# Patient Record
Sex: Male | Born: 1940 | Race: White | Hispanic: No | Marital: Married | Smoking: Former smoker
Health system: Southern US, Community
[De-identification: ages and names within clinical notes are randomized; demographics above are authoritative.]

## PROBLEM LIST (undated history)

## (undated) DIAGNOSIS — Z8501 Personal history of malignant neoplasm of esophagus: Secondary | ICD-10-CM

## (undated) DIAGNOSIS — F32A Depression, unspecified: Secondary | ICD-10-CM

## (undated) DIAGNOSIS — F329 Major depressive disorder, single episode, unspecified: Secondary | ICD-10-CM

## (undated) DIAGNOSIS — K635 Polyp of colon: Secondary | ICD-10-CM

## (undated) DIAGNOSIS — N400 Enlarged prostate without lower urinary tract symptoms: Secondary | ICD-10-CM

## (undated) DIAGNOSIS — M25561 Pain in right knee: Secondary | ICD-10-CM

## (undated) DIAGNOSIS — C801 Malignant (primary) neoplasm, unspecified: Secondary | ICD-10-CM

## (undated) DIAGNOSIS — Z1379 Encounter for other screening for genetic and chromosomal anomalies: Principal | ICD-10-CM

## (undated) DIAGNOSIS — M1990 Unspecified osteoarthritis, unspecified site: Secondary | ICD-10-CM

## (undated) DIAGNOSIS — I4891 Unspecified atrial fibrillation: Secondary | ICD-10-CM

## (undated) DIAGNOSIS — K22719 Barrett's esophagus with dysplasia, unspecified: Secondary | ICD-10-CM

## (undated) DIAGNOSIS — M25512 Pain in left shoulder: Secondary | ICD-10-CM

## (undated) DIAGNOSIS — I499 Cardiac arrhythmia, unspecified: Secondary | ICD-10-CM

## (undated) DIAGNOSIS — N189 Chronic kidney disease, unspecified: Secondary | ICD-10-CM

## (undated) DIAGNOSIS — M25562 Pain in left knee: Secondary | ICD-10-CM

## (undated) DIAGNOSIS — E7800 Pure hypercholesterolemia, unspecified: Secondary | ICD-10-CM

## (undated) DIAGNOSIS — R972 Elevated prostate specific antigen [PSA]: Secondary | ICD-10-CM

## (undated) DIAGNOSIS — I639 Cerebral infarction, unspecified: Secondary | ICD-10-CM

## (undated) DIAGNOSIS — I10 Essential (primary) hypertension: Secondary | ICD-10-CM

## (undated) DIAGNOSIS — K219 Gastro-esophageal reflux disease without esophagitis: Secondary | ICD-10-CM

## (undated) HISTORY — PX: PROSTATE SURGERY: SHX751

## (undated) HISTORY — DX: Polyp of colon: K63.5

## (undated) HISTORY — PX: OTHER SURGICAL HISTORY: SHX169

## (undated) HISTORY — DX: Personal history of malignant neoplasm of esophagus: Z85.01

## (undated) HISTORY — PX: TONSILLECTOMY: SUR1361

## (undated) HISTORY — PX: KNEE ARTHROSCOPY: SUR90

## (undated) HISTORY — PX: ADENOIDECTOMY: SUR15

## (undated) HISTORY — DX: Encounter for other screening for genetic and chromosomal anomalies: Z13.79

---

## 2006-12-17 ENCOUNTER — Ambulatory Visit: Payer: Self-pay | Admitting: Gastroenterology

## 2007-09-24 ENCOUNTER — Ambulatory Visit: Payer: Self-pay

## 2007-10-18 ENCOUNTER — Other Ambulatory Visit: Payer: Self-pay

## 2007-10-18 ENCOUNTER — Ambulatory Visit: Payer: Self-pay | Admitting: Unknown Physician Specialty

## 2007-10-25 ENCOUNTER — Ambulatory Visit: Payer: Self-pay | Admitting: Unknown Physician Specialty

## 2008-09-18 DIAGNOSIS — I639 Cerebral infarction, unspecified: Secondary | ICD-10-CM

## 2008-09-18 HISTORY — DX: Cerebral infarction, unspecified: I63.9

## 2008-12-21 ENCOUNTER — Ambulatory Visit: Payer: Self-pay | Admitting: Gastroenterology

## 2009-05-01 ENCOUNTER — Emergency Department: Payer: Self-pay | Admitting: Emergency Medicine

## 2011-03-27 ENCOUNTER — Inpatient Hospital Stay: Payer: Self-pay | Admitting: Internal Medicine

## 2011-03-31 ENCOUNTER — Inpatient Hospital Stay: Payer: Self-pay | Admitting: Internal Medicine

## 2011-04-12 ENCOUNTER — Inpatient Hospital Stay: Payer: Self-pay | Admitting: Internal Medicine

## 2011-04-27 ENCOUNTER — Ambulatory Visit: Payer: Self-pay | Admitting: Urology

## 2011-05-02 ENCOUNTER — Emergency Department: Payer: Self-pay | Admitting: *Deleted

## 2011-05-10 ENCOUNTER — Ambulatory Visit: Payer: Self-pay | Admitting: Urology

## 2011-05-10 HISTORY — PX: TURP VAPORIZATION: SUR1397

## 2012-01-16 ENCOUNTER — Ambulatory Visit: Payer: Self-pay | Admitting: Unknown Physician Specialty

## 2012-04-15 DIAGNOSIS — N400 Enlarged prostate without lower urinary tract symptoms: Secondary | ICD-10-CM | POA: Insufficient documentation

## 2012-04-15 DIAGNOSIS — R972 Elevated prostate specific antigen [PSA]: Secondary | ICD-10-CM | POA: Insufficient documentation

## 2012-04-15 DIAGNOSIS — M25569 Pain in unspecified knee: Secondary | ICD-10-CM | POA: Insufficient documentation

## 2012-04-15 DIAGNOSIS — Z0189 Encounter for other specified special examinations: Secondary | ICD-10-CM | POA: Insufficient documentation

## 2012-04-15 DIAGNOSIS — I10 Essential (primary) hypertension: Secondary | ICD-10-CM | POA: Insufficient documentation

## 2012-04-15 DIAGNOSIS — K22719 Barrett's esophagus with dysplasia, unspecified: Secondary | ICD-10-CM | POA: Insufficient documentation

## 2012-10-11 DIAGNOSIS — F329 Major depressive disorder, single episode, unspecified: Secondary | ICD-10-CM | POA: Insufficient documentation

## 2012-10-11 DIAGNOSIS — E7800 Pure hypercholesterolemia, unspecified: Secondary | ICD-10-CM | POA: Insufficient documentation

## 2013-09-18 HISTORY — PX: CATARACT EXTRACTION: SUR2

## 2013-11-21 ENCOUNTER — Ambulatory Visit: Payer: Self-pay | Admitting: Unknown Physician Specialty

## 2014-12-22 DIAGNOSIS — Z87448 Personal history of other diseases of urinary system: Secondary | ICD-10-CM | POA: Insufficient documentation

## 2014-12-22 DIAGNOSIS — Z9079 Acquired absence of other genital organ(s): Secondary | ICD-10-CM | POA: Insufficient documentation

## 2015-09-16 DIAGNOSIS — M1711 Unilateral primary osteoarthritis, right knee: Secondary | ICD-10-CM | POA: Insufficient documentation

## 2016-09-07 DIAGNOSIS — M25512 Pain in left shoulder: Secondary | ICD-10-CM | POA: Insufficient documentation

## 2017-01-29 ENCOUNTER — Encounter: Payer: Self-pay | Admitting: *Deleted

## 2017-01-30 ENCOUNTER — Ambulatory Visit
Admission: RE | Admit: 2017-01-30 | Discharge: 2017-01-30 | Disposition: A | Discharge status: Home / Self Care | Payer: Medicare Other | Source: Ambulatory Visit | Attending: Gastroenterology | Admitting: Gastroenterology

## 2017-01-30 ENCOUNTER — Ambulatory Visit: Payer: Medicare Other | Admitting: Anesthesiology

## 2017-01-30 ENCOUNTER — Encounter
Admission: RE | Disposition: A | Discharge status: Home / Self Care | Payer: Self-pay | Source: Ambulatory Visit | Attending: Gastroenterology

## 2017-01-30 DIAGNOSIS — D122 Benign neoplasm of ascending colon: Secondary | ICD-10-CM | POA: Insufficient documentation

## 2017-01-30 DIAGNOSIS — N529 Male erectile dysfunction, unspecified: Secondary | ICD-10-CM | POA: Diagnosis not present

## 2017-01-30 DIAGNOSIS — Z1211 Encounter for screening for malignant neoplasm of colon: Secondary | ICD-10-CM | POA: Insufficient documentation

## 2017-01-30 DIAGNOSIS — E7800 Pure hypercholesterolemia, unspecified: Secondary | ICD-10-CM | POA: Diagnosis not present

## 2017-01-30 DIAGNOSIS — D120 Benign neoplasm of cecum: Secondary | ICD-10-CM | POA: Insufficient documentation

## 2017-01-30 DIAGNOSIS — F329 Major depressive disorder, single episode, unspecified: Secondary | ICD-10-CM | POA: Insufficient documentation

## 2017-01-30 DIAGNOSIS — I4891 Unspecified atrial fibrillation: Secondary | ICD-10-CM | POA: Insufficient documentation

## 2017-01-30 DIAGNOSIS — Z87891 Personal history of nicotine dependence: Secondary | ICD-10-CM | POA: Insufficient documentation

## 2017-01-30 DIAGNOSIS — N189 Chronic kidney disease, unspecified: Secondary | ICD-10-CM | POA: Diagnosis not present

## 2017-01-30 DIAGNOSIS — D127 Benign neoplasm of rectosigmoid junction: Secondary | ICD-10-CM | POA: Diagnosis not present

## 2017-01-30 DIAGNOSIS — Z79899 Other long term (current) drug therapy: Secondary | ICD-10-CM | POA: Insufficient documentation

## 2017-01-30 DIAGNOSIS — D124 Benign neoplasm of descending colon: Secondary | ICD-10-CM | POA: Diagnosis not present

## 2017-01-30 DIAGNOSIS — Z8673 Personal history of transient ischemic attack (TIA), and cerebral infarction without residual deficits: Secondary | ICD-10-CM | POA: Insufficient documentation

## 2017-01-30 DIAGNOSIS — K219 Gastro-esophageal reflux disease without esophagitis: Secondary | ICD-10-CM | POA: Diagnosis not present

## 2017-01-30 DIAGNOSIS — D123 Benign neoplasm of transverse colon: Secondary | ICD-10-CM | POA: Diagnosis not present

## 2017-01-30 DIAGNOSIS — N400 Enlarged prostate without lower urinary tract symptoms: Secondary | ICD-10-CM | POA: Diagnosis not present

## 2017-01-30 DIAGNOSIS — K5730 Diverticulosis of large intestine without perforation or abscess without bleeding: Secondary | ICD-10-CM | POA: Diagnosis not present

## 2017-01-30 DIAGNOSIS — I129 Hypertensive chronic kidney disease with stage 1 through stage 4 chronic kidney disease, or unspecified chronic kidney disease: Secondary | ICD-10-CM | POA: Insufficient documentation

## 2017-01-30 DIAGNOSIS — D125 Benign neoplasm of sigmoid colon: Secondary | ICD-10-CM | POA: Diagnosis not present

## 2017-01-30 HISTORY — DX: Depression, unspecified: F32.A

## 2017-01-30 HISTORY — DX: Essential (primary) hypertension: I10

## 2017-01-30 HISTORY — DX: Benign prostatic hyperplasia without lower urinary tract symptoms: N40.0

## 2017-01-30 HISTORY — PX: COLONOSCOPY WITH PROPOFOL: SHX5780

## 2017-01-30 HISTORY — DX: Unspecified osteoarthritis, unspecified site: M19.90

## 2017-01-30 HISTORY — DX: Barrett's esophagus with dysplasia, unspecified: K22.719

## 2017-01-30 HISTORY — DX: Major depressive disorder, single episode, unspecified: F32.9

## 2017-01-30 HISTORY — DX: Pain in right knee: M25.561

## 2017-01-30 HISTORY — DX: Gastro-esophageal reflux disease without esophagitis: K21.9

## 2017-01-30 HISTORY — DX: Elevated prostate specific antigen (PSA): R97.20

## 2017-01-30 HISTORY — DX: Chronic kidney disease, unspecified: N18.9

## 2017-01-30 HISTORY — DX: Cerebral infarction, unspecified: I63.9

## 2017-01-30 HISTORY — DX: Cardiac arrhythmia, unspecified: I49.9

## 2017-01-30 HISTORY — DX: Pain in left knee: M25.562

## 2017-01-30 HISTORY — DX: Pure hypercholesterolemia, unspecified: E78.00

## 2017-01-30 HISTORY — DX: Pain in left shoulder: M25.512

## 2017-01-30 SURGERY — COLONOSCOPY WITH PROPOFOL
Anesthesia: General

## 2017-01-30 MED ORDER — PROPOFOL 10 MG/ML IV BOLUS
INTRAVENOUS | Status: DC | PRN
  Administered 2017-01-30: 50 mg via INTRAVENOUS

## 2017-01-30 MED ORDER — EPHEDRINE SULFATE 50 MG/ML IJ SOLN
INTRAMUSCULAR | Status: AC
  Filled 2017-01-30: qty 1

## 2017-01-30 MED ORDER — SODIUM CHLORIDE 0.9 % IV SOLN
INTRAVENOUS | Status: DC
Start: 2017-01-30 — End: 2017-01-30

## 2017-01-30 MED ORDER — LIDOCAINE HCL 2 % IJ SOLN
INTRAMUSCULAR | Status: AC
  Filled 2017-01-30: qty 10

## 2017-01-30 MED ORDER — FENTANYL CITRATE (PF) 100 MCG/2ML IJ SOLN
INTRAMUSCULAR | Status: DC | PRN
  Administered 2017-01-30: 50 ug via INTRAVENOUS

## 2017-01-30 MED ORDER — PHENYLEPHRINE HCL 10 MG/ML IJ SOLN
INTRAMUSCULAR | Status: DC | PRN
  Administered 2017-01-30 (×2): 100 ug via INTRAVENOUS

## 2017-01-30 MED ORDER — PROPOFOL 500 MG/50ML IV EMUL
INTRAVENOUS | Status: AC
  Filled 2017-01-30: qty 50

## 2017-01-30 MED ORDER — PROPOFOL 500 MG/50ML IV EMUL
INTRAVENOUS | Status: DC | PRN
  Administered 2017-01-30: 140 ug/kg/min via INTRAVENOUS

## 2017-01-30 MED ORDER — PROPOFOL 10 MG/ML IV BOLUS
INTRAVENOUS | Status: AC
  Filled 2017-01-30: qty 20

## 2017-01-30 MED ORDER — LIDOCAINE HCL (CARDIAC) 20 MG/ML IV SOLN
INTRAVENOUS | Status: DC | PRN
  Administered 2017-01-30: 40 mg via INTRAVENOUS

## 2017-01-30 MED ORDER — GLYCOPYRROLATE 0.2 MG/ML IJ SOLN
INTRAMUSCULAR | Status: DC | PRN
  Administered 2017-01-30: .2 mg via INTRAVENOUS

## 2017-01-30 MED ORDER — FENTANYL CITRATE (PF) 100 MCG/2ML IJ SOLN
INTRAMUSCULAR | Status: AC
  Filled 2017-01-30: qty 2

## 2017-01-30 MED ORDER — GLYCOPYRROLATE 0.2 MG/ML IJ SOLN
INTRAMUSCULAR | Status: AC
  Filled 2017-01-30: qty 1

## 2017-01-30 MED ORDER — SODIUM CHLORIDE 0.9 % IV SOLN
INTRAVENOUS | Status: DC
Start: 2017-01-30 — End: 2017-01-30
  Administered 2017-01-30: 14:00:00 via INTRAVENOUS

## 2017-01-30 MED ORDER — EPHEDRINE SULFATE 50 MG/ML IJ SOLN
INTRAMUSCULAR | Status: DC | PRN
  Administered 2017-01-30: 10 mg via INTRAVENOUS

## 2017-01-30 NOTE — Anesthesia Post-op Follow-up Note (Cosign Needed)
Anesthesia QCDR form completed.        

## 2017-01-30 NOTE — Op Note (Signed)
[facility] Gastroenterology Patient Name: Blake Jenkins Procedure Date: 01/30/2017 1:22 PM MRN: [id number] Account #: [id number] Date of Birth: Dec 18, 1940 Admit Type: Outpatient Age: 76 Room: [facility] ENDO ROOM 3 Gender: Male Note Status: Finalized Procedure:            Colonoscopy Indications:          Screening for colorectal malignant neoplasm Providers:            Lollie Sails, MD Referring MD:         Bo Mcclintock. Vickki Muff (Referring MD) Medicines:            Monitored Anesthesia Care Complications:        No immediate complications. Procedure:            Pre-Anesthesia Assessment:                       - ASA Grade Assessment: III - A patient with severe                        systemic disease.                       After obtaining informed consent, the colonoscope was                        passed under direct vision. Throughout the procedure,                        the patient's blood pressure, pulse, and oxygen                        saturations were monitored continuously. The                        Colonoscope was introduced through the anus and                        advanced to the the cecum, identified by appendiceal                        orifice and ileocecal valve. The colonoscopy was                        unusually difficult due to poor bowel prep and a                        tortuous colon. Successful completion of the procedure                        was aided by changing the patient to a supine position,                        changing the patient to a prone position and using                        manual pressure. The quality of the bowel preparation                        was fair except the ascending colon was poor. Findings:      A 4 mm  polyp was found in the recto-sigmoid colon. The polyp was       sessile. The polyp was removed with a cold snare. Resection and       retrieval were complete.      A 5 mm polyp was found in the descending colon.  The polyp was sessile.       The polyp was removed with a cold snare. Resection and retrieval were       complete.      A 3 mm polyp was found in the proximal transverse colon. The polyp was       sessile. The polyp was removed with a cold biopsy forceps. Resection and       retrieval were complete.      A 7 mm polyp was found in the proximal transverse colon. The polyp was       sessile. The polyp was removed with a cold snare. Resection and       retrieval were complete.      A 4 mm polyp was found in the transverse colon. The polyp was sessile.       The polyp was removed with a cold snare. Resection and retrieval were       complete.      Three sessile and semi-pedunculated polyps were found in the cecum. The       polyps were 4 to 10 mm in size. These polyps were removed with a cold       snare. Resection and retrieval were complete.      A 5 mm polyp was found in the hepatic flexure. The polyp was sessile.       The polyp was removed with a cold snare. Resection and retrieval were       complete.      Two sessile polyps were found in the ascending colon. The polyps were 4       to 5 mm in size. These polyps were removed with a cold snare. Resection       and retrieval were complete.      Two sessile polyps were found in the ascending colon. The polyps were 3       to 4 mm in size. These polyps were removed with a cold snare. Resection       and retrieval were complete.      A 3 mm polyp was found in the proximal transverse colon. The polyp was       sessile. The polyp was removed with a cold biopsy forceps. Resection and       retrieval were complete.      A 5 mm polyp was found in the mid sigmoid colon. The polyp was sessile.       The polyp was removed with a cold biopsy forceps. Resection and       retrieval were complete.      multiple sessile polyps were found in the right colon, removed with       snare and forcep, not placed serially but in a single jar.      A few  small-mouthed diverticula were found in the sigmoid colon.      The digital rectal exam was normal. Impression:           - One 4 mm polyp at the recto-sigmoid colon, removed  with a cold snare. Resected and retrieved.                       - One 5 mm polyp in the descending colon, removed with                        a cold snare. Resected and retrieved.                       - One 3 mm polyp in the proximal transverse colon,                        removed with a cold biopsy forceps. Resected and                        retrieved.                       - One 7 mm polyp in the proximal transverse colon,                        removed with a cold snare. Resected and retrieved.                       - One 4 mm polyp in the transverse colon, removed with                        a cold snare. Resected and retrieved.                       - Three 4 to 10 mm polyps in the cecum, removed with a                        cold snare. Resected and retrieved.                       - One 5 mm polyp at the hepatic flexure, removed with a                        cold snare. Resected and retrieved.                       - Two 4 to 5 mm polyps in the ascending colon, removed                        with a cold snare. Resected and retrieved.                       - Two 3 to 4 mm polyps in the ascending colon, removed                        with a cold snare. Resected and retrieved.                       - One 3 mm polyp in the proximal transverse colon,                        removed with a cold biopsy forceps. Resected and  retrieved.                       - One 5 mm polyp in the mid sigmoid colon, removed with                        a cold biopsy forceps. Resected and retrieved.                       - Multiple polyps.                       - Diverticulosis in the sigmoid colon. Recommendation:       - Await pathology results.                       - Clear liquid  diet today.                       - Full liquid diet for 1 day, then advance as tolerated                        to soft diet for 3 days. Procedure Code(s):    --- Professional ---                       [phone], Colonoscopy, flexible; with removal of tumor(s),                        polyp(s), or other lesion(s) by snare technique                       45380, 25, Colonoscopy, flexible; with biopsy, single                        or multiple Diagnosis Code(s):    --- Professional ---                       Z12.11, Encounter for screening for malignant neoplasm                        of colon                       D12.7, Benign neoplasm of rectosigmoid junction                       D12.4, Benign neoplasm of descending colon                       D12.3, Benign neoplasm of transverse colon (hepatic                        flexure or splenic flexure)                       D12.5, Benign neoplasm of sigmoid colon                       D12.0, Benign neoplasm of cecum                       D12.2, Benign neoplasm of ascending colon  K57.30, Diverticulosis of large intestine without                        perforation or abscess without bleeding CPT copyright 2016 [employer]. All rights reserved. The codes documented in this report are preliminary and upon coder review may  be revised to meet current compliance requirements. Lollie Sails, MD 01/30/2017 3:30:58 PM This report has been signed electronically. Number of Addenda: 0 Note Initiated On: 01/30/2017 1:22 PM Scope Withdrawal Time: 0 hours 58 minutes 58 seconds  Total Procedure Duration: 1 hour 31 minutes 34 seconds       [facility]

## 2017-01-30 NOTE — Anesthesia Preprocedure Evaluation (Signed)
Anesthesia Evaluation  Patient identified by MRN, date of birth, ID band Patient awake    Reviewed: Allergy & Precautions, H&P , NPO status , Patient's Chart, lab work & pertinent test results, reviewed documented beta blocker date and time   Airway Mallampati: II   Neck ROM: full    Dental  (+) Poor Dentition, Teeth Intact   Pulmonary neg pulmonary ROS, former smoker,    Pulmonary exam normal        Cardiovascular hypertension, On Medications negative cardio ROS Normal cardiovascular exam+ dysrhythmias  Rhythm:regular Rate:Normal     Neuro/Psych PSYCHIATRIC DISORDERS CVA negative neurological ROS  negative psych ROS   GI/Hepatic negative GI ROS, Neg liver ROS, GERD  Medicated,  Endo/Other  negative endocrine ROS  Renal/GU Renal diseasenegative Renal ROS  negative genitourinary   Musculoskeletal   Abdominal   Peds  Hematology negative hematology ROS (+)   Anesthesia Other Findings Past Medical History: No date: Arthritis     Comment: RIGHT KNEE No date: Barrett's esophagus with dysplasia     Comment: resection 2011 No date: Chronic kidney disease No date: Depression No date: Dysrhythmia     Comment: A-FIB,TRANSIENT No date: Elevated PSA No date: GERD (gastroesophageal reflux disease) No date: Hypercholesteremia No date: Hypertension No date: Hypertrophy of prostate without urinary obstru* No date: Knee pain, bilateral No date: Shoulder pain, left No date: Stroke [facility]) Past Surgical History: No date: ADENOIDECTOMY No date: ESOPHAGEAL RESECTION No date: KNEE ARTHROSCOPY Left No date: PROSTATE SURGERY No date: TONSILLECTOMY 05/10/2011: TURP VAPORIZATION BMI    Body Mass Index:  22.96 kg/m     Reproductive/Obstetrics negative OB ROS                             Anesthesia Physical Anesthesia Plan  ASA: III  Anesthesia Plan: General   Post-op Pain Management:     Induction:   Airway Management Planned:   Additional Equipment:   Intra-op Plan:   Post-operative Plan:   Informed Consent: I have reviewed the patients History and Physical, chart, labs and discussed the procedure including the risks, benefits and alternatives for the proposed anesthesia with the patient or authorized representative who has indicated his/her understanding and acceptance.   Dental Advisory Given  Plan Discussed with: CRNA  Anesthesia Plan Comments:         Anesthesia Quick Evaluation

## 2017-01-30 NOTE — H&P (Signed)
Outpatient short stay form Pre-procedure 01/30/2017 1:16 PM Lollie Sails MD  Primary Physician: Dr. Kirkland Hun  Reason for visit:  Colonoscopy  History of present illness:  Patient is a 76 year old male presenting today as above. He tolerated his prep well. He takes no aspirin or blood thinning products. His last colonoscopy was over 10 years ago. He does get regular endoscopies in [place] with Dr. Felton Clinton due to a history of Barrett's esophagus with partial/distal esophagectomy.    Current Facility-Administered Medications:  .  0.9 %  sodium chloride infusion, , Intravenous, Continuous, Lollie Sails, MD .  0.9 %  sodium chloride infusion, , Intravenous, Continuous, Lollie Sails, MD  Prescriptions Prior to Admission  Medication Sig Dispense Refill Last Dose  . atorvastatin (LIPITOR) 20 MG tablet Take 20 mg by mouth daily.   01/29/2017 at Unknown time  . lisinopril (PRINIVIL,ZESTRIL) 5 MG tablet Take 5 mg by mouth daily.   01/29/2017 at Unknown time  . omeprazole (PRILOSEC) 40 MG capsule Take 40 mg by mouth daily.   01/29/2017 at Unknown time  . polyethylene glycol powder (GLYCOLAX/MIRALAX) powder Take 1 Container by mouth once.     . sertraline (ZOLOFT) 100 MG tablet Take 100 mg by mouth daily.   01/29/2017 at Unknown time  . sildenafil (VIAGRA) 100 MG tablet Take 100 mg by mouth daily as needed for erectile dysfunction.        Not on File   Past Medical History:  Diagnosis Date  . Arthritis    RIGHT KNEE  . Barrett's esophagus with dysplasia    resection 2011  . Chronic kidney disease   . Depression   . Dysrhythmia    A-FIB,TRANSIENT  . Elevated PSA   . GERD (gastroesophageal reflux disease)   . Hypercholesteremia   . Hypertension   . Hypertrophy of prostate without urinary obstruction   . Knee pain, bilateral   . Shoulder pain, left   . Stroke [facility])     Review of systems:      Physical Exam    Heart and lungs: Regular rate and rhythm without rub or  gallop, lungs are bilaterally clear.    HEENT: Normocephalic atraumatic eyes are anicteric    Other:     Pertinant exam for procedure: Soft nontender nondistended bowel sounds positive normoactive.    Planned proceedures: Colonoscopy and indicated procedures. I have discussed the risks benefits and complications of procedures to include not limited to bleeding, infection, perforation and the risk of sedation and the patient wishes to proceed.    Lollie Sails, MD Gastroenterology 01/30/2017  1:16 PM

## 2017-01-30 NOTE — Anesthesia Procedure Notes (Signed)
Date/Time: 01/30/2017 1:30 PM Performed by: Allean Found Pre-anesthesia Checklist: Patient identified, Emergency Drugs available, Suction available, Patient being monitored and Timeout performed Patient Re-evaluated:Patient Re-evaluated prior to inductionOxygen Delivery Method: Nasal cannula Placement Confirmation: positive ETCO2

## 2017-01-30 NOTE — Transfer of Care (Signed)
Immediate Anesthesia Transfer of Care Note  Patient: Blake Jenkins  Procedure(s) Performed: Procedure(s): COLONOSCOPY WITH PROPOFOL (N/A)  Patient Location: PACU  Anesthesia Type:General  Level of Consciousness: awake  Airway & Oxygen Therapy: Patient Spontanous Breathing and Patient connected to nasal cannula oxygen  Post-op Assessment: Report given to RN and Post -op Vital signs reviewed and stable  Post vital signs: Reviewed and stable  Last Vitals:  Vitals:   01/30/17 1151  BP: 122/83  Pulse: (!) 54  Resp: (!) 22  Temp: (!) 35.7 C    Last Pain:  Vitals:   01/30/17 1151  TempSrc: Tympanic         Complications: No apparent anesthesia complications

## 2017-01-31 ENCOUNTER — Encounter: Payer: Self-pay | Admitting: Gastroenterology

## 2017-02-01 LAB — SURGICAL PATHOLOGY

## 2017-02-02 NOTE — Anesthesia Postprocedure Evaluation (Signed)
Anesthesia Post Note  Patient: Blake Jenkins  Procedure(s) Performed: Procedure(s) (LRB): COLONOSCOPY WITH PROPOFOL (N/A)  Patient location during evaluation: Endoscopy Anesthesia Type: General Level of consciousness: awake and alert Pain management: pain level controlled Vital Signs Assessment: post-procedure vital signs reviewed and stable Respiratory status: spontaneous breathing, nonlabored ventilation, respiratory function stable and patient connected to nasal cannula oxygen Cardiovascular status: blood pressure returned to baseline and stable Postop Assessment: no signs of nausea or vomiting Anesthetic complications: no     Last Vitals:  Vitals:   01/30/17 1553 01/30/17 1603  BP: 105/76 107/87  Pulse: (!) 57 (!) 54  Resp: 16 20  Temp:      Last Pain:  Vitals:   01/30/17 1523  TempSrc: Tympanic                 Martha Clan

## 2017-02-06 NOTE — Addendum Note (Signed)
Addendum  created 02/06/17 1343 by Molli Barrows, MD   Anesthesia Attestations filed, Anesthesia Event edited

## 2017-08-14 DIAGNOSIS — D126 Benign neoplasm of colon, unspecified: Secondary | ICD-10-CM | POA: Insufficient documentation

## 2017-12-17 ENCOUNTER — Telehealth: Payer: Self-pay | Admitting: Genetic Counselor

## 2017-12-17 ENCOUNTER — Encounter: Payer: Self-pay | Admitting: Genetic Counselor

## 2017-12-17 NOTE — Telephone Encounter (Signed)
A genetic counseling appt has been scheduled for the pt to see Ofri on 4/11 at 10:15am. Pt aware to arrive 30 minutes early. Letter mailed.

## 2017-12-19 ENCOUNTER — Encounter: Payer: Self-pay | Admitting: *Deleted

## 2017-12-20 ENCOUNTER — Encounter
Admission: RE | Disposition: A | Discharge status: Home / Self Care | Payer: Self-pay | Source: Ambulatory Visit | Attending: Gastroenterology

## 2017-12-20 ENCOUNTER — Ambulatory Visit
Admission: RE | Admit: 2017-12-20 | Discharge: 2017-12-20 | Disposition: A | Discharge status: Home / Self Care | Payer: Medicare Other | Source: Ambulatory Visit | Attending: Gastroenterology | Admitting: Gastroenterology

## 2017-12-20 ENCOUNTER — Ambulatory Visit: Payer: Medicare Other | Admitting: Anesthesiology

## 2017-12-20 DIAGNOSIS — I4891 Unspecified atrial fibrillation: Secondary | ICD-10-CM | POA: Diagnosis not present

## 2017-12-20 DIAGNOSIS — I129 Hypertensive chronic kidney disease with stage 1 through stage 4 chronic kidney disease, or unspecified chronic kidney disease: Secondary | ICD-10-CM | POA: Insufficient documentation

## 2017-12-20 DIAGNOSIS — D123 Benign neoplasm of transverse colon: Secondary | ICD-10-CM | POA: Insufficient documentation

## 2017-12-20 DIAGNOSIS — D120 Benign neoplasm of cecum: Secondary | ICD-10-CM | POA: Diagnosis not present

## 2017-12-20 DIAGNOSIS — Z87891 Personal history of nicotine dependence: Secondary | ICD-10-CM | POA: Diagnosis not present

## 2017-12-20 DIAGNOSIS — K635 Polyp of colon: Secondary | ICD-10-CM | POA: Diagnosis not present

## 2017-12-20 DIAGNOSIS — K5730 Diverticulosis of large intestine without perforation or abscess without bleeding: Secondary | ICD-10-CM | POA: Diagnosis not present

## 2017-12-20 DIAGNOSIS — N400 Enlarged prostate without lower urinary tract symptoms: Secondary | ICD-10-CM | POA: Insufficient documentation

## 2017-12-20 DIAGNOSIS — F329 Major depressive disorder, single episode, unspecified: Secondary | ICD-10-CM | POA: Insufficient documentation

## 2017-12-20 DIAGNOSIS — Z8673 Personal history of transient ischemic attack (TIA), and cerebral infarction without residual deficits: Secondary | ICD-10-CM | POA: Insufficient documentation

## 2017-12-20 DIAGNOSIS — Z79899 Other long term (current) drug therapy: Secondary | ICD-10-CM | POA: Diagnosis not present

## 2017-12-20 DIAGNOSIS — Z1211 Encounter for screening for malignant neoplasm of colon: Secondary | ICD-10-CM | POA: Insufficient documentation

## 2017-12-20 DIAGNOSIS — Z86010 Personal history of colonic polyps: Secondary | ICD-10-CM | POA: Diagnosis not present

## 2017-12-20 DIAGNOSIS — K219 Gastro-esophageal reflux disease without esophagitis: Secondary | ICD-10-CM | POA: Diagnosis not present

## 2017-12-20 DIAGNOSIS — K621 Rectal polyp: Secondary | ICD-10-CM | POA: Diagnosis not present

## 2017-12-20 DIAGNOSIS — M1711 Unilateral primary osteoarthritis, right knee: Secondary | ICD-10-CM | POA: Insufficient documentation

## 2017-12-20 DIAGNOSIS — N189 Chronic kidney disease, unspecified: Secondary | ICD-10-CM | POA: Insufficient documentation

## 2017-12-20 HISTORY — PX: COLONOSCOPY WITH PROPOFOL: SHX5780

## 2017-12-20 HISTORY — DX: Benign prostatic hyperplasia without lower urinary tract symptoms: N40.0

## 2017-12-20 SURGERY — COLONOSCOPY WITH PROPOFOL
Anesthesia: General

## 2017-12-20 MED ORDER — EPHEDRINE SULFATE 50 MG/ML IJ SOLN
INTRAMUSCULAR | Status: DC | PRN
  Administered 2017-12-20: 10 mg via INTRAVENOUS

## 2017-12-20 MED ORDER — PROPOFOL 500 MG/50ML IV EMUL
INTRAVENOUS | Status: AC
  Filled 2017-12-20: qty 50

## 2017-12-20 MED ORDER — PHENYLEPHRINE HCL 10 MG/ML IJ SOLN
INTRAMUSCULAR | Status: DC | PRN
  Administered 2017-12-20: 200 ug via INTRAVENOUS

## 2017-12-20 MED ORDER — LIDOCAINE HCL (CARDIAC) 20 MG/ML IV SOLN
INTRAVENOUS | Status: DC | PRN
  Administered 2017-12-20: 100 mg via INTRAVENOUS

## 2017-12-20 MED ORDER — PROPOFOL 10 MG/ML IV BOLUS
INTRAVENOUS | Status: DC | PRN
  Administered 2017-12-20: 100 mg via INTRAVENOUS

## 2017-12-20 MED ORDER — PROPOFOL 500 MG/50ML IV EMUL
INTRAVENOUS | Status: DC | PRN
  Administered 2017-12-20: 125 ug/kg/min via INTRAVENOUS

## 2017-12-20 MED ORDER — SODIUM CHLORIDE 0.9 % IV SOLN
INTRAVENOUS | Status: DC
  Administered 2017-12-20: 1000 mL via INTRAVENOUS

## 2017-12-20 MED ORDER — SODIUM CHLORIDE 0.9 % IV SOLN
INTRAVENOUS | Status: DC
  Administered 2017-12-20: 09:00:00 via INTRAVENOUS

## 2017-12-20 NOTE — Transfer of Care (Signed)
Immediate Anesthesia Transfer of Care Note  Patient: Blake Jenkins  Procedure(s) Performed: COLONOSCOPY WITH PROPOFOL (N/A )  Patient Location: PACU  Anesthesia Type:General  Level of Consciousness: awake and alert   Airway & Oxygen Therapy: Patient Spontanous Breathing and Patient connected to nasal cannula oxygen  Post-op Assessment: Report given to RN and Post -op Vital signs reviewed and stable  Post vital signs: Reviewed and stable  Last Vitals:  Vitals Value Taken Time  BP    Temp    Pulse 68 12/20/2017  9:55 AM  Resp 17 12/20/2017  9:55 AM  SpO2 100 % 12/20/2017  9:55 AM  Vitals shown include unvalidated device data.  Last Pain:  Vitals:   12/20/17 0824  TempSrc: Tympanic  PainSc: 0-No pain         Complications: No apparent anesthesia complications

## 2017-12-20 NOTE — Anesthesia Postprocedure Evaluation (Signed)
Anesthesia Post Note  Patient: Blake Jenkins Myrie  Procedure(s) Performed: COLONOSCOPY WITH PROPOFOL (N/A )  Patient location during evaluation: Endoscopy Anesthesia Type: General Level of consciousness: awake and alert Pain management: pain level controlled Vital Signs Assessment: post-procedure vital signs reviewed and stable Respiratory status: spontaneous breathing and respiratory function stable Cardiovascular status: stable Anesthetic complications: no     Last Vitals:  Vitals:   12/20/17 0824 12/20/17 0955  BP: 122/88   Pulse: (!) 57   Resp: 20 17  Temp: (!) 36.2 C (!) 36.1 C  SpO2: 100%     Last Pain:  Vitals:   12/20/17 0955  TempSrc: Tympanic  PainSc: Asleep                 Yaseen Gilberg K

## 2017-12-20 NOTE — Anesthesia Preprocedure Evaluation (Addendum)
Anesthesia Evaluation  Patient identified by MRN, date of birth, ID band Patient awake    Reviewed: Allergy & Precautions, NPO status , Patient's Chart, lab work & pertinent test results  History of Anesthesia Complications Negative for: history of anesthetic complications  Airway Mallampati: II       Dental   Pulmonary neg sleep apnea, neg COPD, former smoker,           Cardiovascular hypertension, Pt. on medications (-) Past MI and (-) CHF (-) dysrhythmias (-) Valvular Problems/Murmurs     Neuro/Psych neg Seizures Depression    GI/Hepatic Neg liver ROS, GERD  Medicated and Poorly Controlled,  Endo/Other  neg diabetes  Renal/GU Renal InsufficiencyRenal disease     Musculoskeletal   Abdominal   Peds  Hematology   Anesthesia Other Findings   Reproductive/Obstetrics                            Anesthesia Physical Anesthesia Plan  ASA: II  Anesthesia Plan: General   Post-op Pain Management:    Induction:   PONV Risk Score and Plan: 2 and Propofol infusion and TIVA  Airway Management Planned: Nasal Cannula  Additional Equipment:   Intra-op Plan:   Post-operative Plan:   Informed Consent: I have reviewed the patients History and Physical, chart, labs and discussed the procedure including the risks, benefits and alternatives for the proposed anesthesia with the patient or authorized representative who has indicated his/her understanding and acceptance.     Plan Discussed with:   Anesthesia Plan Comments:         Anesthesia Quick Evaluation

## 2017-12-20 NOTE — Op Note (Signed)
[facility] Gastroenterology Patient Name: Blake Jenkins Procedure Date: 12/20/2017 9:06 AM MRN: [id number] Account #: [id number] Date of Birth: Jan 05, 1941 Admit Type: Outpatient Age: 77 Room: [facility] ENDO ROOM 1 Gender: Male Note Status: Finalized Procedure:            Colonoscopy Indications:          Personal history of colonic polyps Providers:            Lollie Sails, MD Referring MD:         Bo Mcclintock. Vickki Muff (Referring MD) Medicines:            Monitored Anesthesia Care Complications:        No immediate complications. Procedure:            Pre-Anesthesia Assessment:                       - ASA Grade Assessment: II - A patient with mild                        systemic disease.                       After obtaining informed consent, the colonoscope was                        passed under direct vision. Throughout the procedure,                        the patient's blood pressure, pulse, and oxygen                        saturations were monitored continuously. The                        Colonoscope was introduced through the anus and                        advanced to the the cecum, identified by appendiceal                        orifice and ileocecal valve. The colonoscopy was                        performed with moderate difficulty due to a tortuous                        colon. Successful completion of the procedure was aided                        by changing the patient to a supine position, changing                        the patient to a prone position and using manual                        pressure. The patient tolerated the procedure well. The                        quality of the bowel preparation was fair. Findings:      Multiple small-mouthed diverticula were found  in the sigmoid colon,       descending colon and transverse colon.      Two sessile polyps were found in the sigmoid colon. The polyps were 1 to       2 mm in size. These polyps were  removed with a cold biopsy forceps.       Resection and retrieval were complete.      A 10 mm polyp was found in the proximal transverse colon. The polyp was       pedunculated. The polyp was removed with a hot snare. Resection and       retrieval were complete.      A 7 mm polyp was found in the mid transverse colon. The polyp was       pedunculated. The polyp was removed with a hot snare. Resection and       retrieval were complete.      A 3 mm polyp was found in the cecum. The polyp was sessile. The polyp       was removed with a piecemeal technique using a cold biopsy forceps.       Resection and retrieval were complete.      A 3 mm polyp was found in the proximal ascending colon. The polyp was       sessile. The polyp was removed with a cold biopsy forceps. Resection and       retrieval were complete.      A 2 mm polyp was found in the recto-sigmoid colon. The polyp was       sessile. The polyp was removed with a cold biopsy forceps. Resection and       retrieval were complete.      The digital rectal exam findings include enlarged prostate.      No additional abnormalities were found on retroflexion. Impression:           - Preparation of the colon was fair.                       - Diverticulosis in the sigmoid colon, in the                        descending colon and in the transverse colon.                       - Two 1 to 2 mm polyps in the sigmoid colon, removed                        with a cold biopsy forceps. Resected and retrieved.                       - One 10 mm polyp in the proximal transverse colon,                        removed with a hot snare. Resected and retrieved.                       - One 7 mm polyp in the mid transverse colon, removed                        with a hot snare. Resected and retrieved.                       -  One 3 mm polyp in the cecum, removed piecemeal using                        a cold biopsy forceps. Resected and retrieved.                        - One 3 mm polyp in the proximal ascending colon,                        removed with a cold biopsy forceps. Resected and                        retrieved.                       - One 2 mm polyp at the recto-sigmoid colon, removed                        with a cold biopsy forceps. Resected and retrieved.                       - Enlarged prostate found on digital rectal exam. Recommendation:       - Discharge patient to home.                       - Telephone GI clinic for pathology results in 1 week. Procedure Code(s):    --- Professional ---                       [phone], Colonoscopy, flexible; with removal of tumor(s),                        polyp(s), or other lesion(s) by snare technique                       45380, 77, Colonoscopy, flexible; with biopsy, single                        or multiple Diagnosis Code(s):    --- Professional ---                       D12.5, Benign neoplasm of sigmoid colon                       D12.3, Benign neoplasm of transverse colon (hepatic                        flexure or splenic flexure)                       D12.0, Benign neoplasm of cecum                       D12.2, Benign neoplasm of ascending colon                       D12.7, Benign neoplasm of rectosigmoid junction                       Z86.010, Personal history of colonic polyps  K57.30, Diverticulosis of large intestine without                        perforation or abscess without bleeding                       N40.0, Benign prostatic hyperplasia without lower                        urinary tract symptoms CPT copyright 2017 [employer]. All rights reserved. The codes documented in this report are preliminary and upon coder review may  be revised to meet current compliance requirements. Lollie Sails, MD 12/20/2017 9:53:42 AM This report has been signed electronically. Number of Addenda: 0 Note Initiated On: 12/20/2017 9:06 AM Scope Withdrawal  Time: 0 hours 7 minutes 0 seconds  Total Procedure Duration: 0 hours 12 minutes 31 seconds       [facility]

## 2017-12-20 NOTE — H&P (Signed)
Outpatient short stay form Pre-procedure 12/20/2017 9:04 AM Blake Sails MD  Primary Physician: Dr. Norman Clay  Reason for visit: Colonoscopy  History of present illness: Patient is a 77 year old male presenting today for colonoscopy.  He had a colonoscopy about a year ago with the removal of about 15 polyps all of which were adenoma.  Genetic study is pending.  At that time he had a poor prep for colonoscopy.  He is presenting today for repeat procedure.  He tolerated his prep well.  He takes no aspirin or blood thinning agent.  He states he is much better prep today.    Current Facility-Administered Medications:  .  0.9 %  sodium chloride infusion, , Intravenous, Continuous, Blake Sails, MD, Last Rate: 20 mL/hr at 12/20/17 0842, 1,000 mL at 12/20/17 0842  Medications Prior to Admission  Medication Sig Dispense Refill Last Dose  . atorvastatin (LIPITOR) 20 MG tablet Take 20 mg by mouth daily.   12/19/2017 at Unknown time  . lisinopril (PRINIVIL,ZESTRIL) 5 MG tablet Take 5 mg by mouth daily.   12/19/2017 at Unknown time  . omeprazole (PRILOSEC) 40 MG capsule Take 40 mg by mouth daily.   12/19/2017 at Unknown time  . polyethylene glycol powder (GLYCOLAX/MIRALAX) powder Take 1 Container by mouth once.   12/19/2017 at Unknown time  . sertraline (ZOLOFT) 100 MG tablet Take 100 mg by mouth daily.   12/19/2017 at Unknown time  . sildenafil (VIAGRA) 100 MG tablet Take 100 mg by mouth daily as needed for erectile dysfunction.   Past Week at Unknown time     No Known Allergies   Past Medical History:  Diagnosis Date  . Arthritis    RIGHT KNEE  . Barrett's esophagus with dysplasia    resection 2011  . BPH (benign prostatic hyperplasia)   . Chronic kidney disease   . Depression   . Dysrhythmia    A-FIB,TRANSIENT  . Elevated PSA   . GERD (gastroesophageal reflux disease)   . Hypercholesteremia   . Hypertension   . Hypertrophy of prostate without urinary obstruction   . Knee pain,  bilateral   . Shoulder pain, left   . Stroke [facility])     Review of systems:      Physical Exam    Heart and lungs: Regular rate and rhythm without rub or gallop, lungs are bilaterally clear.    HEENT: Normocephalic atraumatic eyes are anicteric    Other:    Pertinant exam for procedure: Soft nontender nondistended bowel sounds positive normoactive.    Planned proceedures: Colonoscopy and indicated procedures. I have discussed the risks benefits and complications of procedures to include not limited to bleeding, infection, perforation and the risk of sedation and the patient wishes to proceed.    Blake Sails, MD Gastroenterology 12/20/2017  9:04 AM

## 2017-12-20 NOTE — Anesthesia Post-op Follow-up Note (Signed)
Anesthesia QCDR form completed.        

## 2017-12-21 ENCOUNTER — Encounter: Payer: Self-pay | Admitting: Gastroenterology

## 2017-12-21 LAB — SURGICAL PATHOLOGY

## 2017-12-27 ENCOUNTER — Inpatient Hospital Stay: Payer: Medicare Other

## 2017-12-27 ENCOUNTER — Inpatient Hospital Stay: Payer: Medicare Other | Attending: Genetic Counselor | Admitting: Genetic Counselor

## 2017-12-27 ENCOUNTER — Encounter: Payer: Self-pay | Admitting: Genetic Counselor

## 2017-12-27 DIAGNOSIS — D126 Benign neoplasm of colon, unspecified: Secondary | ICD-10-CM

## 2017-12-27 DIAGNOSIS — Z8501 Personal history of malignant neoplasm of esophagus: Secondary | ICD-10-CM

## 2017-12-27 DIAGNOSIS — K635 Polyp of colon: Secondary | ICD-10-CM | POA: Insufficient documentation

## 2017-12-27 NOTE — Progress Notes (Signed)
[facility]      Initial Visit   Patient Name: Blake Jenkins Patient DOB: 29-May-1941 Patient Age: 77 y.o. Encounter Date: 12/27/2017  Referring Provider: Loistine Simas, MD  Primary Care Provider: Clarisse Gouge, MD  Reason for Visit: Evaluate for hereditary susceptibility to cancer    Assessment and Plan:  . Blake Jenkins's history of multiple adenomatous polyps is concerning, but his family history is not suggestive of a hereditary predisposition to cancer. This may be due to biallelic mutations in a recessive gene associated with polyposis.  . Testing is recommended to determine whether he has a pathogenic mutation that will impact his screening and risk-reduction for cancer. A negative result will be reassuring.  . Blake Jenkins wished to pursue genetic testing and a blood sample will be sent for analysis of the 83 genes on [facility]'s Multi-Cancer panel (ALK, APC, ATM, AXIN2, BAP1, BARD1, BLM, BMPR1A, BRCA1, BRCA2, BRIP1, CASR, CDC73, CDH1, CDK4, CDKN1B, CDKN1C, CDKN2A, CEBPA, CHEK2, CTNNA1, DICER1, DIS3L2, EGFR, EPCAM, FH, FLCN, GATA2, GPC3, GREM1, HOXB13, HRAS, KIT, MAX, MEN1, MET, MITF, MLH1, MSH2, MSH3, MSH6, MUTYH, NBN, NF1, NF2, NTHL1, PALB2, PDGFRA, PHOX2B, PMS2, POLD1, POLE, POT1, PRKAR1A, PTCH1, PTEN, RAD50, RAD51C, RAD51D, RB1, RECQL4, RET, RUNX1, SDHA, SDHAF2, SDHB, SDHC, SDHD, SMAD4, SMARCA4, SMARCB1, SMARCE1, STK11, SUFU, TERC, TERT, TMEM127, TP53, TSC1, TSC2, VHL, WRN, WT1).   . Results should be available in approximately 2-4 weeks, at which point we will contact him and address implications for him as well as address genetic testing for at-risk family members, if needed.     Dr. Jana Jenkins was available for questions concerning this case. Total time spent by me in face-to-face counseling was approximately 30 minutes.   _____________________________________________________________________   History of Present Illness: Mr. Blake Jenkins, a 77 y.o. male,  is being seen at the [facility] due to a personal history of colon polyps. He presents to clinic today to discuss the possibility of a hereditary predisposition to cancer and discuss whether genetic testing is warranted.  Blake Jenkins has a history of esophageal cancer diagnosed in 2000. In 2018 he had a colonoscopy and 15 adenomatous polyps were removed. In 2019, 3 additional adenomas and one hyperplastic polyp were removed.  Past Medical History:  Diagnosis Date  . Arthritis    RIGHT KNEE  . Barrett's esophagus with dysplasia    resection 2011  . BPH (benign prostatic hyperplasia)   . Chronic kidney disease   . Colon polyps   . Depression   . Dysrhythmia    A-FIB,TRANSIENT  . Elevated PSA   . GERD (gastroesophageal reflux disease)   . History of esophageal cancer   . Hypercholesteremia   . Hypertension   . Hypertrophy of prostate without urinary obstruction   . Knee pain, bilateral   . Shoulder pain, left   . Stroke [facility])     Past Surgical History:  Procedure Laterality Date  . ADENOIDECTOMY    . COLONOSCOPY WITH PROPOFOL N/A 01/30/2017   Procedure: COLONOSCOPY WITH PROPOFOL;  Surgeon: Lollie Sails, MD;  Location: [facility] ENDOSCOPY;  Service: Endoscopy;  Laterality: N/A;  . COLONOSCOPY WITH PROPOFOL N/A 12/20/2017   Procedure: COLONOSCOPY WITH PROPOFOL;  Surgeon: Lollie Sails, MD;  Location: [facility] ENDOSCOPY;  Service: Endoscopy;  Laterality: N/A;  . ESOPHAGEAL RESECTION    . KNEE ARTHROSCOPY Left   . PROSTATE SURGERY    . TONSILLECTOMY    . TURP VAPORIZATION  05/10/2011    Social History   Socioeconomic History  . Marital status: Married    Spouse name: Not on file  . Number of children: Not on file  . Years of education: Not on file  . Highest education level: Not on file  Occupational History  . Not on file  Social Needs  . Financial resource strain: Not on file  . Food insecurity:    Worry: Not on file    Inability: Not on file  .  Transportation needs:    Medical: Not on file    Non-medical: Not on file  Tobacco Use  . Smoking status: Former Smoker    Packs/day: 1.00    Types: Cigarettes    Last attempt to quit: 04/15/1972    Years since quitting: 45.7  . Smokeless tobacco: Never Used  Substance and Sexual Activity  . Alcohol use: No  . Drug use: Not on file  . Sexual activity: Not on file  Lifestyle  . Physical activity:    Days per week: Not on file    Minutes per session: Not on file  . Stress: Not on file  Relationships  . Social connections:    Talks on phone: Not on file    Gets together: Not on file    Attends religious service: Not on file    Active member of club or organization: Not on file    Attends meetings of clubs or organizations: Not on file    Relationship status: Not on file  Other Topics Concern  . Not on file  Social History Narrative  . Not on file     Family History:  During the visit, a 4-generation pedigree was obtained. Family tree will be scanned in the Media tab in Epic  There are no cancers reported in his family.  He has 2 sons (ages 46 and 38). He has a sister (age 59). His mother died at 62. She had 2 sisters and a brother. His father died in an accident at 18. He had one brother.  Mr. Blake Jenkins's ancestry is Caucasian - NOS. There is no known Jewish ancestry and no consanguinity.  Discussion: We reviewed the characteristics, features and inheritance patterns of hereditary cancer syndromes with a focus on dominant and recessive genes predisposing to polyposis. We discussed his risk of harboring a mutation in the context of his personal and family history. We discussed the process of genetic testing, insurance coverage and implications of results: positive, negative and variant of unknown significance (VUS).    Blake Jenkins's questions were answered to his satisfaction today and he is welcome to call with any additional questions or concerns. Thank you for the referral and  allowing [place] to share in the care of your patient.    Steele Berg, MS, [place] Certified Genetic Counselor phone: [phone] [email or web address]

## 2018-01-04 ENCOUNTER — Ambulatory Visit: Payer: Self-pay | Admitting: Genetic Counselor

## 2018-01-04 ENCOUNTER — Encounter: Payer: Self-pay | Admitting: Genetic Counselor

## 2018-01-04 DIAGNOSIS — Z1379 Encounter for other screening for genetic and chromosomal anomalies: Secondary | ICD-10-CM

## 2018-01-04 HISTORY — DX: Encounter for other screening for genetic and chromosomal anomalies: Z13.79

## 2018-01-04 NOTE — Progress Notes (Signed)
Cancer Genetics Clinic       Genetic Test Results    Patient Name: Blake Jenkins Patient DOB: 1940-10-18 Patient Age: 77 y.o. Encounter Date: 01/04/2018  Referring Provider: Loistine Simas, MD  Primary Care Provider: Clarisse Gouge, MD   Mr. Blake Jenkins was called today to discuss genetic test results. Please see the Genetics note from his visit on 12/27/2017 for a detailed discussion of his personal and family history.  Genetic Testing: At the time of Mr. Blake Jenkins's visit, he decided to pursue genetic testing of multiple genes associated with hereditary susceptibility to cancer. Testing included sequencing and deletion/duplication analysis. Testing did not reveal a pathogenic mutation in any of the genes analyzed.  A copy of the genetic test report will be scanned into Epic under the Media tab.  The genes analyzed were the 83 genes on [facility]'s Multi-Cancer panel (ALK, APC, ATM, AXIN2, BAP1, BARD1, BLM, BMPR1A, BRCA1, BRCA2, BRIP1, CASR, CDC73, CDH1, CDK4, CDKN1B, CDKN1C, CDKN2A, CEBPA, CHEK2, CTNNA1, DICER1, DIS3L2, EGFR, EPCAM, FH, FLCN, GATA2, GPC3, GREM1, HOXB13, HRAS, KIT, MAX, MEN1, MET, MITF, MLH1, MSH2, MSH3, MSH6, MUTYH, NBN, NF1, NF2, NTHL1, PALB2, PDGFRA, PHOX2B, PMS2, POLD1, POLE, POT1, PRKAR1A, PTCH1, PTEN, RAD50, RAD51C, RAD51D, RB1, RECQL4, RET, RUNX1, SDHA, SDHAF2, SDHB, SDHC, SDHD, SMAD4, SMARCA4, SMARCB1, SMARCE1, STK11, SUFU, TERC, TERT, TMEM127, TP53, TSC1, TSC2, VHL, WRN, WT1).  Since the current test is not perfect, it is possible that there may be a gene mutation that current testing cannot detect, but that chance is small. It is possible that a different genetic factor, which has not yet been discovered or is not on this panel, is responsible for the cancer diagnoses in the family. Again, the likelihood of this is low. No additional testing is recommended at this time for Mr. Blake Jenkins.  A Variant of Uncertain Significance was detected: ATM c.2189G>A (p.Cys730Tyr.  This is still considered a normal result. While at this time, it is unknown if this finding is associated with increased cancer risk, the majority of these variants get reclassified to be inconsequential. Medical management should not be based on this finding. With time, we suspect the lab will determine the significance, if any. If we do learn more about it, we will try to contact Mr. Blake Jenkins to discuss it further. It is important to stay in touch with [place] periodically and keep the address and phone number up to date.  Cancer Screening: These results suggest that Mr. Blake Jenkins's colon polyps and esophageal cancer were most likely not due to an inherited predisposition. He is recommended to follow the cancer screening guidelines provided by his physician.   Family Members: Family members may be at some increased risk of developing cancer, over the general population risk, simply due to the family history. They are recommended to speak with their own providers about appropriate cancer screenings.  Any relative who had cancer at a young age or had a particularly rare cancer may also wish to pursue genetic testing. Genetic counselors can be located in other cities, by visiting the website of the [employer] ([employer]) and Field seismologist for a Dietitian by zip code.   Family members are not recommended to get tested for the above VUS outside of a research protocol as this finding has no implications for their medical management.   Lastly, cancer genetics is a rapidly advancing field and it is possible that new genetic tests  will be appropriate for Mr. Blake Jenkins in the future. We encourage him to remain in contact with [place] on an annual basis so we can update his personal and family histories, and let him know of advances in cancer genetics that may benefit the family. Our contact number was provided. Mr. Blake Jenkins is welcome to call anytime with additional questions.     Steele Berg, MS,  [place] Certified Genetic Counselor phone: [phone]

## 2018-02-14 DIAGNOSIS — I4891 Unspecified atrial fibrillation: Secondary | ICD-10-CM | POA: Insufficient documentation

## 2018-02-14 DIAGNOSIS — R634 Abnormal weight loss: Secondary | ICD-10-CM | POA: Insufficient documentation

## 2018-02-14 DIAGNOSIS — N183 Chronic kidney disease, stage 3 unspecified: Secondary | ICD-10-CM | POA: Insufficient documentation

## 2018-02-19 ENCOUNTER — Other Ambulatory Visit: Payer: Medicare Other

## 2018-02-19 DIAGNOSIS — Z87448 Personal history of other diseases of urinary system: Secondary | ICD-10-CM

## 2018-02-19 LAB — URINALYSIS, COMPLETE
BILIRUBIN UA: NEGATIVE
Glucose, UA: NEGATIVE
Ketones, UA: NEGATIVE
LEUKOCYTES UA: NEGATIVE
Nitrite, UA: NEGATIVE
PH UA: 6 (ref 5.0–7.5)
PROTEIN UA: NEGATIVE
Urobilinogen, Ur: 0.2 mg/dL (ref 0.2–1.0)

## 2018-02-19 LAB — MICROSCOPIC EXAMINATION
EPITHELIAL CELLS (NON RENAL): NONE SEEN /HPF (ref 0–10)
WBC, UA: NONE SEEN /hpf (ref 0–5)

## 2018-02-21 LAB — CULTURE, URINE COMPREHENSIVE

## 2018-03-01 ENCOUNTER — Encounter: Payer: Self-pay | Admitting: Urology

## 2018-03-01 ENCOUNTER — Ambulatory Visit (INDEPENDENT_AMBULATORY_CARE_PROVIDER_SITE_OTHER): Payer: Medicare Other | Admitting: Urology

## 2018-03-01 VITALS — BP 107/69 | HR 73 | Ht 69.0 in | Wt 158.4 lb

## 2018-03-01 DIAGNOSIS — R319 Hematuria, unspecified: Secondary | ICD-10-CM

## 2018-03-01 DIAGNOSIS — R310 Gross hematuria: Secondary | ICD-10-CM | POA: Diagnosis not present

## 2018-03-01 LAB — MICROSCOPIC EXAMINATION

## 2018-03-01 LAB — URINALYSIS, COMPLETE
BILIRUBIN UA: NEGATIVE
Leukocytes, UA: NEGATIVE
Nitrite, UA: NEGATIVE
PH UA: 5 (ref 5.0–7.5)
Specific Gravity, UA: 1.02 (ref 1.005–1.030)
UUROB: 1 mg/dL (ref 0.2–1.0)

## 2018-03-01 NOTE — Progress Notes (Signed)
03/01/2018 4:30 PM   Blake Jenkins 03-29-1941 [id number]  Referring provider: Clarisse Gouge, MD [address] [place], [place] [address]  Chief Complaint  Patient presents with  . Hematuria    HPI: Blake Jenkins is a 77 year old male who presents for evaluation of gross hematuria.  I saw him in 2015 for gross hematuria and a CT urogram was unremarkable and cystoscopy showed hypervascularity of the prostatic urethra.  He presents today with a 2-week history of intermittent gross hematuria.  He started Eliquis approximately 2 weeks ago.  He has passed clots at times.  He is not had clot retention.  He has no bothersome lower urinary tract symptoms.  Denies flank, abdominal, pelvic or scrotal pain.  He has a history of elevated PSA and when I last saw him we had discussed discontinuing PSA screening which she had desired.  He did have a PSA drawn in April 2019 which was 2.76.  He does have chronic kidney disease and his most recent creatinine was 1.7.  He has a prior history of PVP.   PMH: Past Medical History:  Diagnosis Date  . Arthritis    RIGHT KNEE  . Barrett's esophagus with dysplasia    resection 2011  . BPH (benign prostatic hyperplasia)   . Chronic kidney disease   . Colon polyps   . Depression   . Dysrhythmia    A-FIB,TRANSIENT  . Elevated PSA   . Genetic testing 01/04/2018   Multi-Cancer panel (83 genes) @ [facility] - No pathogenic mutations detected  . GERD (gastroesophageal reflux disease)   . History of esophageal cancer   . Hypercholesteremia   . Hypertension   . Hypertrophy of prostate without urinary obstruction   . Knee pain, bilateral   . Shoulder pain, left   . Stroke [facility])     Surgical History: Past Surgical History:  Procedure Laterality Date  . ADENOIDECTOMY    . COLONOSCOPY WITH PROPOFOL N/A 01/30/2017   Procedure: COLONOSCOPY WITH PROPOFOL;  Surgeon: Lollie Sails, MD;  Location: [facility] ENDOSCOPY;  Service: Endoscopy;   Laterality: N/A;  . COLONOSCOPY WITH PROPOFOL N/A 12/20/2017   Procedure: COLONOSCOPY WITH PROPOFOL;  Surgeon: Lollie Sails, MD;  Location: [facility] ENDOSCOPY;  Service: Endoscopy;  Laterality: N/A;  . ESOPHAGEAL RESECTION    . KNEE ARTHROSCOPY Left   . PROSTATE SURGERY    . TONSILLECTOMY    . TURP VAPORIZATION  05/10/2011    Home Medications:  Allergies as of 03/01/2018      Reactions   Silodosin    Other reaction(s): Syncope Other reaction(s): Syncope; pt states "I don't know where that came from."  "I don't recall this." Other reaction(s): Syncope Other reaction(s): Syncope; pt states "I don't know where that came from.""I don't recall this."      Medication List        Accurate as of 03/01/18  4:30 PM. Always use your most recent med list.          atorvastatin 20 MG tablet Commonly known as:  LIPITOR Take 20 mg by mouth daily.   ELIQUIS 5 MG Tabs tablet Generic drug:  apixaban Take 5 mg by mouth 2 (two) times daily.   lisinopril 5 MG tablet Commonly known as:  PRINIVIL,ZESTRIL Take 5 mg by mouth daily.   omeprazole 40 MG capsule Commonly known as:  PRILOSEC Take 40 mg by mouth daily.   polyethylene glycol powder powder Commonly known as:  GLYCOLAX/MIRALAX Take 1 Container  by mouth once.   sertraline 100 MG tablet Commonly known as:  ZOLOFT Take 100 mg by mouth daily.   sildenafil 100 MG tablet Commonly known as:  VIAGRA Take 100 mg by mouth daily as needed for erectile dysfunction.       Allergies:  Allergies  Allergen Reactions  . Silodosin     Other reaction(s): Syncope Other reaction(s): Syncope; pt states "I don't know where that came from."  "I don't recall this." Other reaction(s): Syncope Other reaction(s): Syncope; pt states "I don't know where that came from.""I don't recall this."     Family History: Family History  Problem Relation Age of Onset  . Prostate cancer Neg Hx   . Bladder Cancer Neg Hx   . Kidney cancer Neg Hx      Social History:  reports that he quit smoking about 45 years ago. His smoking use included cigarettes. He smoked 1.00 pack per day. He has never used smokeless tobacco. He reports that he does not drink alcohol. His drug history is not on file.  ROS: UROLOGY Frequent Urination?: No Hard to postpone urination?: No Burning/pain with urination?: No Get up at night to urinate?: Yes Leakage of urine?: No Urine stream starts and stops?: No Trouble starting stream?: No Do you have to strain to urinate?: No Blood in urine?: Yes Urinary tract infection?: No Sexually transmitted disease?: No Injury to kidneys or bladder?: No Painful intercourse?: No Weak stream?: No Erection problems?: No Penile pain?: No  Gastrointestinal Nausea?: No Vomiting?: No Indigestion/heartburn?: Yes Diarrhea?: No Constipation?: No  Constitutional Fever: No Night sweats?: No Weight loss?: No Fatigue?: No  Skin Skin rash/lesions?: No Itching?: No  Eyes Blurred vision?: No Double vision?: No  Ears/Nose/Throat Sore throat?: No Sinus problems?: No  Hematologic/Lymphatic Swollen glands?: No Easy bruising?: No  Cardiovascular Leg swelling?: No Chest pain?: No  Respiratory Cough?: No Shortness of breath?: No  Endocrine Excessive thirst?: No  Musculoskeletal Back pain?: No Joint pain?: No  Neurological Headaches?: No Dizziness?: No  Psychologic Depression?: No Anxiety?: No  Physical Exam: BP 107/69 (BP Location: Left Arm, Patient Position: Sitting, Cuff Size: Normal)   Pulse 73   Ht 5\' 9"  (1.753 m)   Wt 158 lb 6.4 oz (71.8 kg)   BMI 23.39 kg/m   Constitutional:  Alert and oriented, No acute distress. HEENT: [place] AT, moist mucus membranes.  Trachea midline, no masses. Cardiovascular: No clubbing, cyanosis, or edema. Respiratory: Normal respiratory effort, no increased work of breathing. GI: Abdomen is soft, nontender, nondistended, no abdominal masses GU: No CVA  tenderness Lymph: No cervical or inguinal lymphadenopathy. Skin: No rashes, bruises or suspicious lesions. Neurologic: Grossly intact, no focal deficits, moving all 4 extremities. Psychiatric: Normal mood and affect.  Laboratory Data:  Urinalysis Dipstick 3+ blood/3+ glucose Microscopy 11-30 RBC   Assessment & Plan:   77 year old male with intermittent gross hematuria with last evaluation in 2015.  I recommended a repeat hematuria evaluation.  Due to his chronic kidney disease will schedule MR urography and cystoscopy.   Abbie Sons, [place] [address], [place], [place] [address] [phone]

## 2018-03-03 ENCOUNTER — Encounter: Payer: Self-pay | Admitting: Urology

## 2018-03-04 ENCOUNTER — Other Ambulatory Visit: Payer: Self-pay

## 2018-03-04 ENCOUNTER — Encounter
Admission: RE | Disposition: A | Discharge status: Home / Self Care | Payer: Self-pay | Source: Ambulatory Visit | Attending: Gastroenterology

## 2018-03-04 ENCOUNTER — Ambulatory Visit
Admission: RE | Admit: 2018-03-04 | Discharge: 2018-03-04 | Disposition: A | Discharge status: Home / Self Care | Payer: Medicare Other | Source: Ambulatory Visit | Attending: Gastroenterology | Admitting: Gastroenterology

## 2018-03-04 ENCOUNTER — Ambulatory Visit: Payer: Medicare Other | Admitting: Anesthesiology

## 2018-03-04 ENCOUNTER — Encounter: Payer: Self-pay | Admitting: *Deleted

## 2018-03-04 DIAGNOSIS — N400 Enlarged prostate without lower urinary tract symptoms: Secondary | ICD-10-CM | POA: Diagnosis not present

## 2018-03-04 DIAGNOSIS — Z980 Intestinal bypass and anastomosis status: Secondary | ICD-10-CM | POA: Insufficient documentation

## 2018-03-04 DIAGNOSIS — K3189 Other diseases of stomach and duodenum: Secondary | ICD-10-CM | POA: Insufficient documentation

## 2018-03-04 DIAGNOSIS — Z79899 Other long term (current) drug therapy: Secondary | ICD-10-CM | POA: Insufficient documentation

## 2018-03-04 DIAGNOSIS — F329 Major depressive disorder, single episode, unspecified: Secondary | ICD-10-CM | POA: Diagnosis not present

## 2018-03-04 DIAGNOSIS — I4891 Unspecified atrial fibrillation: Secondary | ICD-10-CM | POA: Insufficient documentation

## 2018-03-04 DIAGNOSIS — M1711 Unilateral primary osteoarthritis, right knee: Secondary | ICD-10-CM | POA: Insufficient documentation

## 2018-03-04 DIAGNOSIS — K210 Gastro-esophageal reflux disease with esophagitis: Secondary | ICD-10-CM | POA: Insufficient documentation

## 2018-03-04 DIAGNOSIS — Z8501 Personal history of malignant neoplasm of esophagus: Secondary | ICD-10-CM | POA: Insufficient documentation

## 2018-03-04 DIAGNOSIS — Z86010 Personal history of colonic polyps: Secondary | ICD-10-CM | POA: Diagnosis not present

## 2018-03-04 DIAGNOSIS — Z8673 Personal history of transient ischemic attack (TIA), and cerebral infarction without residual deficits: Secondary | ICD-10-CM | POA: Diagnosis not present

## 2018-03-04 DIAGNOSIS — Z87891 Personal history of nicotine dependence: Secondary | ICD-10-CM | POA: Insufficient documentation

## 2018-03-04 DIAGNOSIS — Z888 Allergy status to other drugs, medicaments and biological substances status: Secondary | ICD-10-CM | POA: Insufficient documentation

## 2018-03-04 DIAGNOSIS — K219 Gastro-esophageal reflux disease without esophagitis: Secondary | ICD-10-CM | POA: Diagnosis present

## 2018-03-04 DIAGNOSIS — K2950 Unspecified chronic gastritis without bleeding: Secondary | ICD-10-CM | POA: Diagnosis not present

## 2018-03-04 DIAGNOSIS — I10 Essential (primary) hypertension: Secondary | ICD-10-CM | POA: Diagnosis not present

## 2018-03-04 DIAGNOSIS — Z7901 Long term (current) use of anticoagulants: Secondary | ICD-10-CM | POA: Insufficient documentation

## 2018-03-04 DIAGNOSIS — E7800 Pure hypercholesterolemia, unspecified: Secondary | ICD-10-CM | POA: Diagnosis not present

## 2018-03-04 HISTORY — PX: ESOPHAGOGASTRODUODENOSCOPY (EGD) WITH PROPOFOL: SHX5813

## 2018-03-04 SURGERY — ESOPHAGOGASTRODUODENOSCOPY (EGD) WITH PROPOFOL
Anesthesia: General

## 2018-03-04 MED ORDER — PROPOFOL 10 MG/ML IV BOLUS
INTRAVENOUS | Status: DC | PRN
  Administered 2018-03-04: 90 mg via INTRAVENOUS
  Administered 2018-03-04: 21 mg via INTRAVENOUS

## 2018-03-04 MED ORDER — PROPOFOL 500 MG/50ML IV EMUL
INTRAVENOUS | Status: DC | PRN
  Administered 2018-03-04: 140 ug/kg/min via INTRAVENOUS

## 2018-03-04 MED ORDER — SODIUM CHLORIDE 0.9 % IV SOLN
INTRAVENOUS | Status: DC
  Administered 2018-03-04: 10:00:00 via INTRAVENOUS

## 2018-03-04 MED ORDER — LIDOCAINE HCL (CARDIAC) PF 100 MG/5ML IV SOSY
PREFILLED_SYRINGE | INTRAVENOUS | Status: DC | PRN
  Administered 2018-03-04: 50 mg via INTRAVENOUS

## 2018-03-04 MED ORDER — LIDOCAINE HCL (PF) 2 % IJ SOLN
INTRAMUSCULAR | Status: AC
  Filled 2018-03-04: qty 10

## 2018-03-04 MED ORDER — PROPOFOL 500 MG/50ML IV EMUL
INTRAVENOUS | Status: AC
  Filled 2018-03-04: qty 50

## 2018-03-04 NOTE — Anesthesia Preprocedure Evaluation (Signed)
Anesthesia Evaluation  Patient identified by MRN, date of birth, ID band Patient awake    Reviewed: Allergy & Precautions, NPO status , Patient's Chart, lab work & pertinent test results  History of Anesthesia Complications Negative for: history of anesthetic complications  Airway Mallampati: I       Dental  (+) Dental Advidsory Given, Caps, Teeth Intact   Pulmonary neg shortness of breath, neg sleep apnea, neg COPD, neg recent URI, former smoker,           Cardiovascular hypertension, Pt. on medications (-) angina(-) CAD, (-) Past MI, (-) Cardiac Stents, (-) CABG and (-) CHF + dysrhythmias Atrial Fibrillation (-) Valvular Problems/Murmurs     Neuro/Psych neg Seizures PSYCHIATRIC DISORDERS Depression CVA, No Residual Symptoms    GI/Hepatic Neg liver ROS, GERD  Medicated and Poorly Controlled,  Endo/Other  neg diabetes  Renal/GU Renal InsufficiencyRenal disease     Musculoskeletal   Abdominal   Peds  Hematology   Anesthesia Other Findings Past Medical History: No date: Arthritis     Comment:  RIGHT KNEE No date: Barrett's esophagus with dysplasia     Comment:  resection 2011 No date: BPH (benign prostatic hyperplasia) No date: Chronic kidney disease No date: Colon polyps No date: Depression No date: Dysrhythmia     Comment:  A-FIB,TRANSIENT No date: Elevated PSA 01/04/2018: Genetic testing     Comment:  Multi-Cancer panel (83 genes) @ [facility] - No pathogenic               mutations detected No date: GERD (gastroesophageal reflux disease) No date: History of esophageal cancer No date: Hypercholesteremia No date: Hypertension No date: Hypertrophy of prostate without urinary obstruction No date: Knee pain, bilateral No date: Shoulder pain, left No date: Stroke (HCC)   Reproductive/Obstetrics                             Anesthesia Physical  Anesthesia Plan  ASA: II  Anesthesia  Plan: General   Post-op Pain Management:    Induction: Intravenous  PONV Risk Score and Plan: 2 and Propofol infusion and TIVA  Airway Management Planned: Nasal Cannula  Additional Equipment:   Intra-op Plan:   Post-operative Plan:   Informed Consent: I have reviewed the patients History and Physical, chart, labs and discussed the procedure including the risks, benefits and alternatives for the proposed anesthesia with the patient or authorized representative who has indicated his/her understanding and acceptance.     Plan Discussed with:   Anesthesia Plan Comments:         Anesthesia Quick Evaluation  

## 2018-03-04 NOTE — Anesthesia Post-op Follow-up Note (Signed)
Anesthesia QCDR form completed.        

## 2018-03-04 NOTE — H&P (Signed)
Outpatient short stay form Pre-procedure 03/04/2018 10:50 AM Blake Sails MD  Primary Physician: Dr. Norman Clay  Reason for visit: EGD  History of present illness: Patient is a 77 year old male presenting today as above.  He has personal history of esophageal cancer for which she underwent a gastric pull-up in 2001.  Last EGD was 11/11/2015.  Does take a daily PPI but states that it seems to be not working quite as well as it did.  On further discussion with him he is been taking it before bedtime.  Patient also takes Eliquis which he has held for 3 days.  Takes no other blood thinning agent or aspirin product.  Patient has no dysphagia.    Current Facility-Administered Medications:  .  0.9 %  sodium chloride infusion, , Intravenous, Continuous, Blake Sails, MD, Last Rate: 20 mL/hr at 03/04/18 1011  Medications Prior to Admission  Medication Sig Dispense Refill Last Dose  . apixaban (ELIQUIS) 5 MG TABS tablet Take 5 mg by mouth 2 (two) times daily.   02/28/2018 at Unknown time  . atorvastatin (LIPITOR) 20 MG tablet Take 20 mg by mouth daily.   03/03/2018 at 2100  . lisinopril (PRINIVIL,ZESTRIL) 5 MG tablet Take 5 mg by mouth daily.   03/03/2018 at 2100  . omeprazole (PRILOSEC) 40 MG capsule Take 40 mg by mouth daily.   03/03/2018 at 2100  . sertraline (ZOLOFT) 100 MG tablet Take 100 mg by mouth daily.   03/03/2018 at 1900  . sildenafil (VIAGRA) 100 MG tablet Take 100 mg by mouth daily as needed for erectile dysfunction.   Past Month at Unknown time  . polyethylene glycol powder (GLYCOLAX/MIRALAX) powder Take 1 Container by mouth once.   Not Taking at Unknown time     Allergies  Allergen Reactions  . Silodosin     Other reaction(s): Syncope Other reaction(s): Syncope; pt states "I don't know where that came from."  "I don't recall this." Other reaction(s): Syncope Other reaction(s): Syncope; pt states "I don't know where that came from.""I don't recall this."      Past  Medical History:  Diagnosis Date  . Arthritis    RIGHT KNEE  . Barrett's esophagus with dysplasia    resection 2011  . BPH (benign prostatic hyperplasia)   . Chronic kidney disease   . Colon polyps   . Depression   . Dysrhythmia    A-FIB,TRANSIENT  . Elevated PSA   . Genetic testing 01/04/2018   Multi-Cancer panel (83 genes) @ [facility] - No pathogenic mutations detected  . GERD (gastroesophageal reflux disease)   . History of esophageal cancer   . Hypercholesteremia   . Hypertension   . Hypertrophy of prostate without urinary obstruction   . Knee pain, bilateral   . Shoulder pain, left   . Stroke [facility])     Review of systems:      Physical Exam    Heart and lungs: Regular rate and rhythm without rub or gallop, lungs are bilaterally clear.    HEENT: Normal cephalic atraumatic eyes are anicteric    Other:    Pertinant exam for procedure: Soft nontender nondistended bowel sounds positive normoactive    Planned proceedures: EGD and indicated procedures. I have discussed the risks benefits and complications of procedures to include not limited to bleeding, infection, perforation and the risk of sedation and the patient wishes to proceed.    Blake Sails, MD Gastroenterology 03/04/2018  10:50 AM

## 2018-03-04 NOTE — Transfer of Care (Signed)
Immediate Anesthesia Transfer of Care Note  Patient: Anes Rigel [place]owcroft  Procedure(s) Performed: ESOPHAGOGASTRODUODENOSCOPY (EGD) WITH PROPOFOL (N/A )  Patient Location: PACU and Endoscopy Unit  Anesthesia Type:General  Level of Consciousness: drowsy  Airway & Oxygen Therapy: Patient Spontanous Breathing and Patient connected to nasal cannula oxygen  Post-op Assessment: Report given to RN and Post -op Vital signs reviewed and stable  Post vital signs: Reviewed and stable  Last Vitals:  Vitals Value Taken Time  BP    Temp    Pulse 64 03/04/2018 11:27 AM  Resp 28 03/04/2018 11:27 AM  SpO2 97 % 03/04/2018 11:27 AM  Vitals shown include unvalidated device data.  Last Pain:  Vitals:   03/04/18 0958  PainSc: 0-No pain         Complications: No apparent anesthesia complications

## 2018-03-04 NOTE — Anesthesia Postprocedure Evaluation (Signed)
Anesthesia Post Note  Patient: Blake Jenkins  Procedure(s) Performed: ESOPHAGOGASTRODUODENOSCOPY (EGD) WITH PROPOFOL (N/A )  Patient location during evaluation: Endoscopy Anesthesia Type: General Level of consciousness: awake and alert Pain management: pain level controlled Vital Signs Assessment: post-procedure vital signs reviewed and stable Respiratory status: spontaneous breathing, nonlabored ventilation, respiratory function stable and patient connected to nasal cannula oxygen Cardiovascular status: blood pressure returned to baseline and stable Postop Assessment: no apparent nausea or vomiting Anesthetic complications: no     Last Vitals:  Vitals:   03/04/18 0958 03/04/18 1126  BP: 110/74 103/60  Pulse: (!) 59 60  Resp: 16   Temp: (!) 36.3 C (!) 36.1 C  SpO2: 100% 97%    Last Pain:  Vitals:   03/04/18 1134  PainSc: 0-No pain                 Martha Clan

## 2018-03-04 NOTE — Op Note (Addendum)
[facility] Gastroenterology Patient Name: Blake Jenkins Procedure Date: 03/04/2018 10:41 AM MRN: [id number] Account #: [id number] Date of Birth: [birth year] Admit Type: Outpatient Age: 77 Room: [facility] ENDO ROOM 1 Gender: Male Note Status: Finalized Procedure:            Upper GI endoscopy Indications:          Dyspepsia, Esophageal reflux, Personal history of                        malignant esophageal neoplasm Providers:            Lollie Sails, MD Medicines:            Monitored Anesthesia Care Complications:        No immediate complications. Procedure:            Pre-Anesthesia Assessment:                       - ASA Grade Assessment: III - A patient with severe                        systemic disease.                       After obtaining informed consent, the endoscope was                        passed under direct vision. Throughout the procedure,                        the patient's blood pressure, pulse, and oxygen                        saturations were monitored continuously. The Endoscope                        was introduced through the mouth, and advanced to the                        third part of duodenum. The upper GI endoscopy was                        accomplished without difficulty. The patient tolerated                        the procedure well. Findings:      An esophago-gastric anastomosis was found at 30 cm from the incisors.      LA Grade B (one or more mucosal breaks greater than 5 mm, not extending       between the tops of two mucosal folds) esophagitis with no bleeding was       found 30 cm from the incisors. Biopsies were taken with a cold forceps       for histology.      Localized mild inflammation characterized by congestion (edema) was       found in the gastric body. Biopsies were taken with a cold forceps for       histology.      Diffuse nodular mucosa was found in the duodenal bulb. Biopsies were       taken with a cold  forceps for histology.  The exam of the duodenum was otherwise normal.      Patchy mild inflammation characterized by erythema was found in the       gastric antrum. Biopsies were taken with a cold forceps for histology.      Patchy mild inflammation characterized by congestion (edema) and       erythema was found in the gastric body. Biopsies were taken with a cold       forceps for histology.      Biopsies were taken from about 33 cm from the incisors at an area of       erythema and edema, and from a area of irritation 1 cm below the       anastomosis. Impression:           - An esophago-gastric anastomosis was found.                       - LA Grade B erosive esophagitis. Biopsied.                       - Gastritis. Biopsied.                       - Nodular mucosa in the duodenal bulb. Biopsied.                       - Bile gastritis. Biopsied.                       - Bile gastritis. Biopsied. Recommendation:       - Full liquid diet today, then advance as tolerated to                        advance diet as tolerated.                       - Await pathology results.                       - Use Protonix (pantoprazole) 40 mg PO BID for 1 month.                       - Use sucralfate tablets 1 gram PO QID for 1 month.                       - Repeat upper endoscopy in 1 month to check healing                        and to evaluate the response to therapy. Procedure Code(s):    --- Professional ---                       [phone], Esophagogastroduodenoscopy, flexible, transoral;                        with biopsy, single or multiple Diagnosis Code(s):    --- Professional ---                       Y85.027, Other specified postprocedural states                       K20.8, Other esophagitis  K29.70, Gastritis, unspecified, without bleeding                       K29.60, Other gastritis without bleeding                       K31.89, Other diseases of stomach and  duodenum                       R10.13, Epigastric pain                       K21.9, Gastro-esophageal reflux disease without                        esophagitis                       Z85.01, Personal history of malignant neoplasm of                        esophagus CPT copyright 2017 American [employer] Association. All rights reserved. The codes documented in this report are preliminary and upon coder review may  be revised to meet current compliance requirements. Lollie Sails, MD 03/04/2018 11:30:44 AM This report has been signed electronically. Number of Addenda: 0 Note Initiated On: 03/04/2018 10:41 AM      [facility]

## 2018-03-05 ENCOUNTER — Telehealth: Payer: Self-pay | Admitting: Urology

## 2018-03-05 DIAGNOSIS — R310 Gross hematuria: Secondary | ICD-10-CM

## 2018-03-05 LAB — SURGICAL PATHOLOGY

## 2018-03-05 NOTE — Telephone Encounter (Signed)
Spoke with the head radiologist in the MRI department in [place] and he stated that they don't perform MR urography any longer and after going over the patient's information that he would probably benefit from either a MRI w/ contrast or a CT urogram. He said he should be able to tolerate a little contrast?   Sharyn Lull

## 2018-03-06 ENCOUNTER — Encounter: Payer: Self-pay | Admitting: Gastroenterology

## 2018-03-07 ENCOUNTER — Ambulatory Visit: Payer: Self-pay | Admitting: Urology

## 2018-03-08 NOTE — Telephone Encounter (Signed)
Will start with a renal US- order entered

## 2018-03-29 ENCOUNTER — Ambulatory Visit: Payer: Medicare Other

## 2018-04-03 ENCOUNTER — Encounter: Payer: Self-pay | Admitting: *Deleted

## 2018-04-04 ENCOUNTER — Encounter: Payer: Self-pay | Admitting: Anesthesiology

## 2018-04-04 ENCOUNTER — Ambulatory Visit
Admission: RE | Admit: 2018-04-04 | Discharge: 2018-04-04 | Disposition: A | Discharge status: Home / Self Care | Payer: Medicare Other | Source: Ambulatory Visit | Attending: Gastroenterology | Admitting: Gastroenterology

## 2018-04-04 ENCOUNTER — Ambulatory Visit: Payer: Medicare Other | Admitting: Anesthesiology

## 2018-04-04 ENCOUNTER — Encounter
Admission: RE | Disposition: A | Discharge status: Home / Self Care | Payer: Self-pay | Source: Ambulatory Visit | Attending: Gastroenterology

## 2018-04-04 DIAGNOSIS — Z09 Encounter for follow-up examination after completed treatment for conditions other than malignant neoplasm: Secondary | ICD-10-CM | POA: Diagnosis not present

## 2018-04-04 DIAGNOSIS — K2950 Unspecified chronic gastritis without bleeding: Secondary | ICD-10-CM | POA: Diagnosis not present

## 2018-04-04 DIAGNOSIS — Z79899 Other long term (current) drug therapy: Secondary | ICD-10-CM | POA: Insufficient documentation

## 2018-04-04 DIAGNOSIS — Z7901 Long term (current) use of anticoagulants: Secondary | ICD-10-CM | POA: Diagnosis not present

## 2018-04-04 DIAGNOSIS — I129 Hypertensive chronic kidney disease with stage 1 through stage 4 chronic kidney disease, or unspecified chronic kidney disease: Secondary | ICD-10-CM | POA: Diagnosis not present

## 2018-04-04 DIAGNOSIS — F329 Major depressive disorder, single episode, unspecified: Secondary | ICD-10-CM | POA: Insufficient documentation

## 2018-04-04 DIAGNOSIS — Z8501 Personal history of malignant neoplasm of esophagus: Secondary | ICD-10-CM | POA: Diagnosis not present

## 2018-04-04 DIAGNOSIS — K210 Gastro-esophageal reflux disease with esophagitis: Secondary | ICD-10-CM | POA: Diagnosis not present

## 2018-04-04 DIAGNOSIS — E7800 Pure hypercholesterolemia, unspecified: Secondary | ICD-10-CM | POA: Insufficient documentation

## 2018-04-04 DIAGNOSIS — N189 Chronic kidney disease, unspecified: Secondary | ICD-10-CM | POA: Insufficient documentation

## 2018-04-04 DIAGNOSIS — K219 Gastro-esophageal reflux disease without esophagitis: Secondary | ICD-10-CM | POA: Insufficient documentation

## 2018-04-04 DIAGNOSIS — M1711 Unilateral primary osteoarthritis, right knee: Secondary | ICD-10-CM | POA: Diagnosis not present

## 2018-04-04 HISTORY — DX: Unspecified atrial fibrillation: I48.91

## 2018-04-04 HISTORY — PX: ESOPHAGOGASTRODUODENOSCOPY (EGD) WITH PROPOFOL: SHX5813

## 2018-04-04 SURGERY — ESOPHAGOGASTRODUODENOSCOPY (EGD) WITH PROPOFOL
Anesthesia: General

## 2018-04-04 MED ORDER — LIDOCAINE HCL (CARDIAC) PF 100 MG/5ML IV SOSY
PREFILLED_SYRINGE | INTRAVENOUS | Status: DC | PRN
  Administered 2018-04-04: 80 mg via INTRAVENOUS

## 2018-04-04 MED ORDER — SODIUM CHLORIDE 0.9 % IV SOLN
INTRAVENOUS | Status: DC
  Administered 2018-04-04: 1000 mL via INTRAVENOUS

## 2018-04-04 MED ORDER — PROPOFOL 10 MG/ML IV BOLUS
INTRAVENOUS | Status: DC | PRN
  Administered 2018-04-04: 60 mg via INTRAVENOUS

## 2018-04-04 MED ORDER — PROPOFOL 500 MG/50ML IV EMUL
INTRAVENOUS | Status: DC | PRN
  Administered 2018-04-04: 100 ug/kg/min via INTRAVENOUS

## 2018-04-04 MED ORDER — LIDOCAINE HCL (PF) 1 % IJ SOLN
2.0000 mL | Freq: Once | INTRAMUSCULAR | Status: AC
  Administered 2018-04-04: 0.3 mL via INTRADERMAL

## 2018-04-04 MED ORDER — FENTANYL CITRATE (PF) 100 MCG/2ML IJ SOLN
INTRAMUSCULAR | Status: DC | PRN
  Administered 2018-04-04 (×2): 25 ug via INTRAVENOUS
  Administered 2018-04-04: 50 ug via INTRAVENOUS

## 2018-04-04 NOTE — Transfer of Care (Signed)
Immediate Anesthesia Transfer of Care Note  Patient: Blake Jenkins  Procedure(s) Performed: ESOPHAGOGASTRODUODENOSCOPY (EGD) WITH PROPOFOL (N/A )  Patient Location: PACU  Anesthesia Type:General  Level of Consciousness: awake, alert  and oriented  Airway & Oxygen Therapy: Patient Spontanous Breathing and Patient connected to nasal cannula oxygen  Post-op Assessment: Report given to RN  Post vital signs: Reviewed and stable  Last Vitals:  Vitals Value Taken Time  BP    Temp    Pulse    Resp    SpO2      Last Pain:  Vitals:   04/04/18 1139  TempSrc: Tympanic  PainSc: 0-No pain         Complications: No apparent anesthesia complications

## 2018-04-04 NOTE — Anesthesia Preprocedure Evaluation (Signed)
Anesthesia Evaluation  Patient identified by MRN, date of birth, ID band Patient awake    Reviewed: Allergy & Precautions, NPO status , Patient's Chart, lab work & pertinent test results  History of Anesthesia Complications Negative for: history of anesthetic complications  Airway Mallampati: I       Dental  (+) Dental Advidsory Given, Caps, Teeth Intact   Pulmonary neg shortness of breath, neg sleep apnea, neg COPD, neg recent URI, former smoker,           Cardiovascular hypertension, Pt. on medications (-) angina(-) CAD, (-) Past MI, (-) Cardiac Stents, (-) CABG and (-) CHF + dysrhythmias Atrial Fibrillation (-) Valvular Problems/Murmurs     Neuro/Psych neg Seizures PSYCHIATRIC DISORDERS Depression CVA, No Residual Symptoms    GI/Hepatic Neg liver ROS, GERD  Medicated and Poorly Controlled,  Endo/Other  neg diabetes  Renal/GU Renal InsufficiencyRenal disease     Musculoskeletal   Abdominal   Peds  Hematology   Anesthesia Other Findings Past Medical History: No date: Arthritis     Comment:  RIGHT KNEE No date: Barrett's esophagus with dysplasia     Comment:  resection 2011 No date: BPH (benign prostatic hyperplasia) No date: Chronic kidney disease No date: Colon polyps No date: Depression No date: Dysrhythmia     Comment:  A-FIB,TRANSIENT No date: Elevated PSA 01/04/2018: Genetic testing     Comment:  Multi-Cancer panel (83 genes) @ [facility] - No pathogenic               mutations detected No date: GERD (gastroesophageal reflux disease) No date: History of esophageal cancer No date: Hypercholesteremia No date: Hypertension No date: Hypertrophy of prostate without urinary obstruction No date: Knee pain, bilateral No date: Shoulder pain, left No date: Stroke [facility])   Reproductive/Obstetrics                             Anesthesia Physical  Anesthesia Plan  ASA: II  Anesthesia  Plan: General   Post-op Pain Management:    Induction: Intravenous  PONV Risk Score and Plan: 2 and Propofol infusion and TIVA  Airway Management Planned: Nasal Cannula  Additional Equipment:   Intra-op Plan:   Post-operative Plan:   Informed Consent: I have reviewed the patients History and Physical, chart, labs and discussed the procedure including the risks, benefits and alternatives for the proposed anesthesia with the patient or authorized representative who has indicated his/her understanding and acceptance.     Plan Discussed with:   Anesthesia Plan Comments:         Anesthesia Quick Evaluation

## 2018-04-04 NOTE — Anesthesia Post-op Follow-up Note (Signed)
Anesthesia QCDR form completed.        

## 2018-04-04 NOTE — H&P (Addendum)
Outpatient short stay form Pre-procedure 04/04/2018 11:52 AM Lollie Sails MD  Primary Physician: Norman Clay, MD  Reason for visit: EGD  History of present illness: Patient is a 77 year old male presenting today as above.  He has a personal history of esophageal cancer and underwent a resection with gastric pull-up in 2001.  His last EGD was 03/04/2018 with a finding of some marked inflammation at the anastomosis and in the upper part of the stomach.  None of these showed malignancy or dysplasia on biopsies.  No evidence of Barrett's esophagus on these as well.  Was placed on a stay proton pump inhibitor as well as some Carafate for a month.  He is presenting today for recheck.  States he has been doing well.  He has had much less heartburn or discomfort with swallowing.  Is continuing to take the Protonix twice a day.  Patient has held his Eliquis over 48 hours.      Current Facility-Administered Medications:  .  0.9 %  sodium chloride infusion, , Intravenous, Continuous, Lollie Sails, MD .  lidocaine (PF) (XYLOCAINE) 1 % injection 2 mL, 2 mL, Intradermal, Once, Lollie Sails, MD  Medications Prior to Admission  Medication Sig Dispense Refill Last Dose  . pantoprazole (PROTONIX) 40 MG tablet Take 40 mg by mouth 2 (two) times daily.     . sucralfate (CARAFATE) 1 g tablet Take 1 g by mouth 4 (four) times daily -  with meals and at bedtime.     Marland Kitchen VITAMIN D, CHOLECALCIFEROL, PO Take 2,000 Units by mouth 2 (two) times daily.     Marland Kitchen apixaban (ELIQUIS) 5 MG TABS tablet Take 5 mg by mouth 2 (two) times daily.   03/31/2018  . atorvastatin (LIPITOR) 20 MG tablet Take 20 mg by mouth daily.   03/03/2018 at 2100  . lisinopril (PRINIVIL,ZESTRIL) 5 MG tablet Take 5 mg by mouth daily.   03/03/2018 at 2100  . omeprazole (PRILOSEC) 40 MG capsule Take 40 mg by mouth daily.   03/03/2018 at 2100  . polyethylene glycol powder (GLYCOLAX/MIRALAX) powder Take 1 Container by mouth once.   Not Taking  at Unknown time  . sertraline (ZOLOFT) 100 MG tablet Take 100 mg by mouth daily.   03/03/2018 at 1900  . sildenafil (VIAGRA) 100 MG tablet Take 100 mg by mouth daily as needed for erectile dysfunction.   Past Month at Unknown time     Allergies  Allergen Reactions  . Silodosin     Other reaction(s): Syncope Other reaction(s): Syncope; pt states "I don't know where that came from."  "I don't recall this." Other reaction(s): Syncope Other reaction(s): Syncope; pt states "I don't know where that came from.""I don't recall this."      Past Medical History:  Diagnosis Date  . A-fib ([place])   . Arthritis    RIGHT KNEE  . Barrett's esophagus with dysplasia    resection 2011  . BPH (benign prostatic hyperplasia)   . Chronic kidney disease   . Colon polyps   . Depression   . Dysrhythmia    A-FIB,TRANSIENT  . Elevated PSA   . Genetic testing 01/04/2018   Multi-Cancer panel (83 genes) @ [facility] - No pathogenic mutations detected  . GERD (gastroesophageal reflux disease)   . History of esophageal cancer   . Hypercholesteremia   . Hypertension   . Hypertrophy of prostate without urinary obstruction   . Knee pain, bilateral   . Shoulder pain, left   .  Stroke [facility])     Review of systems:      Physical Exam    Heart and lungs: Regular rate and rhythm without rub or gallop, lungs are bilaterally clear.    HEENT: Normocephalic atraumatic eyes are anicteric    Other:    Pertinant exam for procedure: Soft nontender nondistended bowel sounds positive normoactive    Planned proceedures: EGD and indicated procedures. I have discussed the risks benefits and complications of procedures to include not limited to bleeding, infection, perforation and the risk of sedation and the patient wishes to proceed.    Lollie Sails, MD Gastroenterology 04/04/2018  11:52 AM

## 2018-04-04 NOTE — Op Note (Addendum)
[facility] Gastroenterology Patient Name: Blake Jenkins Procedure Date: 04/04/2018 11:21 AM MRN: [id number] Account #: [id number] Date of Birth: 09-13-1941 Admit Type: Outpatient Age: 77 Room: [facility] ENDO ROOM 1 Gender: Male Note Status: Finalized Procedure:            Upper GI endoscopy Indications:          Follow-up of reflux esophagitis, Personal history of                        malignant esophageal neoplasm Providers:            Lollie Sails, MD Referring MD:         Bo Mcclintock. Vickki Muff (Referring MD) Medicines:            Monitored Anesthesia Care Complications:        No immediate complications. Procedure:            Pre-Anesthesia Assessment:                       - ASA Grade Assessment: III - A patient with severe                        systemic disease.                       After obtaining informed consent, the endoscope was                        passed under direct vision. Throughout the procedure,                        the patient's blood pressure, pulse, and oxygen                        saturations were monitored continuously. The Endoscope                        was introduced through the mouth, and advanced to the                        third part of duodenum. The patient tolerated the                        procedure well. The upper GI endoscopy was accomplished                        without difficulty. Findings:      An esophago-gastric anastomosis was found at 30 cm from the incisors.       The previously noted esophagitis and inflamation was much improved.       Thereis still some evidence of a grade a erosive esophagitis, however       overall much improved. Biopsies were taken with a cold forceps for       histology from 30 to 28 cm.      Patchy mild inflammation characterized by congestion (edema) and       erythema was found in the stomach, also much improved from previous       evaluation. Biopsies were taken with a cold forceps for  histology from       the antral area and the upper portion  of the gastric vault. I did not       retroflex due to the narrowness of the gastric pull up area, which would       not insufflate, multiple passes through this area showed no other       lesions.      The pylorus was open to passage of the scope, possibly atonic, with the       apearance of gastric metaplasia asconsistant with previous biopsy.      The exam of the duodenum was otherwise normal. Impression:           - An esophago-gastric anastomosis was found. Grade A                        erosive esophagitis, improved. Biopsied.                       - Gastritis. Biopsied. Recommendation:       - Full liquid diet today.                       - Soft diet for 2 days, then advance as tolerated to                        advance diet as tolerated. Procedure Code(s):    --- Professional ---                       [phone], Esophagogastroduodenoscopy, flexible, transoral;                        with biopsy, single or multiple Diagnosis Code(s):    --- Professional ---                       [phone], Other specified postprocedural states                       K29.70, Gastritis, unspecified, without bleeding                       K21.0, Gastro-esophageal reflux disease with esophagitis                       Z85.01, Personal history of malignant neoplasm of                        esophagus CPT copyright 2017 American Medical Association. All rights reserved. The codes documented in this report are preliminary and upon coder review may  be revised to meet current compliance requirements. Lollie Sails, MD 04/04/2018 12:50:29 PM This report has been signed electronically. Number of Addenda: 0 Note Initiated On: 04/04/2018 11:21 AM      [facility]

## 2018-04-05 ENCOUNTER — Encounter: Payer: Self-pay | Admitting: Gastroenterology

## 2018-04-05 LAB — SURGICAL PATHOLOGY

## 2018-04-06 NOTE — Anesthesia Postprocedure Evaluation (Signed)
Anesthesia Post Note  Patient: Blake Jenkins  Procedure(s) Performed: ESOPHAGOGASTRODUODENOSCOPY (EGD) WITH PROPOFOL (N/A )  Patient location during evaluation: Endoscopy Anesthesia Type: General Level of consciousness: awake and alert Pain management: pain level controlled Vital Signs Assessment: post-procedure vital signs reviewed and stable Respiratory status: spontaneous breathing, nonlabored ventilation, respiratory function stable and patient connected to nasal cannula oxygen Cardiovascular status: blood pressure returned to baseline and stable Postop Assessment: no apparent nausea or vomiting Anesthetic complications: no     Last Vitals:  Vitals:   04/04/18 1310 04/04/18 1311  BP:    Pulse: (!) 105 (!) 105  Resp: 11 11  Temp:    SpO2: 99% 99%    Last Pain:  Vitals:   04/04/18 1240  TempSrc: Tympanic  PainSc:                  Martha Clan

## 2018-04-15 ENCOUNTER — Other Ambulatory Visit: Payer: Medicare Other | Admitting: Urology

## 2018-04-19 ENCOUNTER — Ambulatory Visit
Admission: RE | Admit: 2018-04-19 | Discharge: 2018-04-19 | Disposition: A | Discharge status: Home / Self Care | Payer: Medicare Other | Source: Ambulatory Visit | Attending: Urology | Admitting: Urology

## 2018-04-19 DIAGNOSIS — R310 Gross hematuria: Secondary | ICD-10-CM | POA: Insufficient documentation

## 2018-04-19 DIAGNOSIS — N400 Enlarged prostate without lower urinary tract symptoms: Secondary | ICD-10-CM | POA: Insufficient documentation

## 2018-04-24 ENCOUNTER — Other Ambulatory Visit: Payer: Self-pay | Admitting: Radiology

## 2018-04-24 ENCOUNTER — Ambulatory Visit (INDEPENDENT_AMBULATORY_CARE_PROVIDER_SITE_OTHER): Payer: Medicare Other | Admitting: Urology

## 2018-04-24 ENCOUNTER — Encounter: Payer: Self-pay | Admitting: Urology

## 2018-04-24 VITALS — BP 103/79 | HR 61 | Ht 69.0 in | Wt 157.4 lb

## 2018-04-24 DIAGNOSIS — R310 Gross hematuria: Secondary | ICD-10-CM

## 2018-04-24 LAB — URINALYSIS, COMPLETE
BILIRUBIN UA: NEGATIVE
Glucose, UA: NEGATIVE
Ketones, UA: NEGATIVE
LEUKOCYTES UA: NEGATIVE
Nitrite, UA: NEGATIVE
PH UA: 6 (ref 5.0–7.5)
Specific Gravity, UA: 1.015 (ref 1.005–1.030)
Urobilinogen, Ur: 2 mg/dL — ABNORMAL HIGH (ref 0.2–1.0)

## 2018-04-24 LAB — MICROSCOPIC EXAMINATION
BACTERIA UA: NONE SEEN
EPITHELIAL CELLS (NON RENAL): NONE SEEN /HPF (ref 0–10)
WBC, UA: NONE SEEN /hpf (ref 0–5)

## 2018-04-24 MED ORDER — LIDOCAINE HCL URETHRAL/MUCOSAL 2 % EX GEL
1.0000 "application " | Freq: Once | CUTANEOUS | Status: AC
  Administered 2018-04-24: 1 via URETHRAL

## 2018-04-24 MED ORDER — CIPROFLOXACIN HCL 500 MG PO TABS
500.0000 mg | ORAL_TABLET | Freq: Once | ORAL | Status: AC
  Administered 2018-04-24: 500 mg via ORAL

## 2018-04-24 NOTE — Progress Notes (Signed)
   04/24/18  CC:  Chief Complaint  Patient presents with  . Cysto    HPI: Episode of intermittent total gross painless hematuria after starting Eliquis in early June 2019.  CTU could not be performed secondary to chronic kidney disease.  Renal ultrasound showed no renal masses, calculi or hydronephrosis  Blood pressure 103/79, pulse 61, height 5\' 9"  (1.753 m), weight 157 lb 6.4 oz (71.4 kg). NED. A&Ox3.     Cystoscopy Procedure Note  Patient identification was confirmed, informed consent was obtained, and patient was prepped using Betadine solution.  Lidocaine jelly was administered per urethral meatus.    Preoperative abx where received prior to procedure.     Pre-Procedure: - Inspection reveals a normal caliber ureteral meatus.  Procedure: The flexible cystoscope was introduced without difficulty - No urethral strictures/lesions are present. - TUR defect with some adenoma regrowth and prominent, superficial vessels prostate  - Normal bladder neck -Visualization of the bladder was suboptimal secondary to oozing.  There appeared to be a fairly large area of bullous edema on the anterior and lateral walls of the bladder.  Papillary tumor could not be excluded.  Retroflexion shows back oozing from the prostate   Post-Procedure: - Patient tolerated the procedure well  Assessment/ Plan: Office cystoscopy visualization suboptimal and cannot rule out the possibility of papillary bladder tumor.  I recommended cystoscopy under anesthesia with possible TURBT and bilateral retrograde pyelograms.  The procedure was discussed in detail including potential risks of bleeding, infection and bladder injury.  He indicated all questions were answered and desires to proceed.  Eliquis will need to be discontinued preprocedure and will request cardiology clearance.

## 2018-04-25 ENCOUNTER — Other Ambulatory Visit: Payer: Self-pay

## 2018-04-25 ENCOUNTER — Telehealth: Payer: Self-pay | Admitting: Radiology

## 2018-04-25 ENCOUNTER — Other Ambulatory Visit: Payer: Self-pay | Admitting: Radiology

## 2018-04-25 ENCOUNTER — Inpatient Hospital Stay: Admission: RE | Admit: 2018-04-25 | Payer: Medicare Other | Source: Ambulatory Visit

## 2018-04-25 ENCOUNTER — Encounter
Admission: RE | Admit: 2018-04-25 | Discharge: 2018-04-25 | Disposition: A | Discharge status: Home / Self Care | Payer: Medicare Other | Source: Ambulatory Visit | Attending: Urology | Admitting: Urology

## 2018-04-25 DIAGNOSIS — Z01810 Encounter for preprocedural cardiovascular examination: Secondary | ICD-10-CM | POA: Insufficient documentation

## 2018-04-25 DIAGNOSIS — R001 Bradycardia, unspecified: Secondary | ICD-10-CM | POA: Diagnosis not present

## 2018-04-25 DIAGNOSIS — Z01812 Encounter for preprocedural laboratory examination: Secondary | ICD-10-CM | POA: Diagnosis not present

## 2018-04-25 DIAGNOSIS — I10 Essential (primary) hypertension: Secondary | ICD-10-CM | POA: Insufficient documentation

## 2018-04-25 DIAGNOSIS — R9431 Abnormal electrocardiogram [ECG] [EKG]: Secondary | ICD-10-CM | POA: Insufficient documentation

## 2018-04-25 HISTORY — DX: Malignant (primary) neoplasm, unspecified: C80.1

## 2018-04-25 LAB — BASIC METABOLIC PANEL
Anion gap: 3 — ABNORMAL LOW (ref 5–15)
BUN: 15 mg/dL (ref 8–23)
CO2: 32 mmol/L (ref 22–32)
CREATININE: 1.33 mg/dL — AB (ref 0.61–1.24)
Calcium: 9.1 mg/dL (ref 8.9–10.3)
Chloride: 104 mmol/L (ref 98–111)
GFR calc Af Amer: 58 mL/min — ABNORMAL LOW (ref >60)
GFR, EST NON AFRICAN AMERICAN: 50 mL/min — AB (ref >60)
Glucose, Bld: 106 mg/dL — ABNORMAL HIGH (ref 70–99)
Potassium: 4.3 mmol/L (ref 3.5–5.1)
SODIUM: 139 mmol/L (ref 135–145)

## 2018-04-25 LAB — CBC
HCT: 46.2 % (ref 40.0–52.0)
Hemoglobin: 16.2 g/dL (ref 13.0–18.0)
MCH: 33.1 pg (ref 26.0–34.0)
MCHC: 35.2 g/dL (ref 32.0–36.0)
MCV: 94 fL (ref 80.0–100.0)
Platelets: 184 10*3/uL (ref 150–440)
RBC: 4.91 MIL/uL (ref 4.40–5.90)
RDW: 12.6 % (ref 11.5–14.5)
WBC: 8.6 10*3/uL (ref 3.8–10.6)

## 2018-04-25 NOTE — Telephone Encounter (Signed)
Made patient aware of clearance by Dr Ubaldo Glassing to hold Eliquis beginning 04/27/2018. Patient voices understanding.

## 2018-04-25 NOTE — Patient Instructions (Signed)
Your procedure is scheduled on: Tuesday 04/30/18  Report to [facility]. To find out your arrival time please call [phone] between 1PM - 3PM on Monday 04/29/18.  Remember: Instructions that are not followed completely may result in serious medical risk, up to and including death, or upon the discretion of your surgeon and anesthesiologist your surgery may need to be rescheduled.     _X__ 1. Do not eat food after midnight the night before your procedure.                 No gum chewing or hard candies. You may drink clear liquids up to 2 hours                 before you are scheduled to arrive for your surgery- DO not drink clear                 liquids within 2 hours of the start of your surgery.                 Clear Liquids include:  water, apple juice without pulp, clear carbohydrate                 drink such as Clearfast or Gatorade, Black Coffee or Tea (Do not add                 anything to coffee or tea).  __X__2.  On the morning of surgery brush your teeth with toothpaste and water, you                 may rinse your mouth with mouthwash if you wish.  Do not swallow any              toothpaste of mouthwash.     _X__ 3.  No Alcohol for 24 hours before or after surgery.   _X__ 4.  Do Not Smoke or use e-cigarettes For 24 Hours Prior to Your Surgery.                 Do not use any chewable tobacco products for at least 6 hours prior to                 surgery.  ____  5.  Bring all medications with you on the day of surgery if instructed.   __X__  6.  Notify your doctor if there is any change in your medical condition      (cold, fever, infections).     Do not wear jewelry, make-up, hairpins, clips or nail polish. Do not wear lotions, powders, or perfumes.  Do not shave 48 hours prior to surgery. Men may shave face and neck. Do not bring valuables to the hospital.    [facility] is not responsible for any belongings or  valuables.  Contacts, dentures/partials or body piercings may not be worn into surgery. Bring a case for your contacts, glasses or hearing aids, a denture cup will be supplied. Leave your suitcase in the car. After surgery it may be brought to your room. For patients admitted to the hospital, discharge time is determined by your treatment team.   Patients discharged the day of surgery will not be allowed to drive home.   Please read over the following fact sheets that you were given:   MRSA Information  __X__ Take these medicines the morning of surgery with A SIP OF WATER:  1. pantoprazole (PROTONIX) 40 MG tablet  2.  3.   4.  5.  6.  __X__ Take the last dose of Eliquis Friday 04/26/18. Do not resume until after your surgery.  __X__ Stop Anti-inflammatories 7 days before surgery such as Advil, Ibuprofen, Motrin, BC or Goodies Powder, Naprosyn, Naproxen, Aleve, Aspirin, Meloxicam. May take Tylenol if needed for pain or discomfort.   __X__ Stop all herbal supplements, VITAMIN D TODAY

## 2018-04-28 LAB — CULTURE, URINE COMPREHENSIVE

## 2018-04-29 ENCOUNTER — Telehealth: Payer: Self-pay | Admitting: Radiology

## 2018-04-29 ENCOUNTER — Other Ambulatory Visit: Payer: Self-pay | Admitting: Radiology

## 2018-04-29 DIAGNOSIS — R319 Hematuria, unspecified: Principal | ICD-10-CM

## 2018-04-29 DIAGNOSIS — N390 Urinary tract infection, site not specified: Secondary | ICD-10-CM

## 2018-04-29 MED ORDER — CEFAZOLIN SODIUM-DEXTROSE 2-4 GM/100ML-% IV SOLN
2.0000 g | INTRAVENOUS | Status: AC
  Administered 2018-04-30: 2 g via INTRAVENOUS

## 2018-04-29 MED ORDER — SULFAMETHOXAZOLE-TRIMETHOPRIM 800-160 MG PO TABS: 1.0000 | ORAL_TABLET | Freq: Two times a day (BID) | ORAL | 0 refills | Status: DC

## 2018-04-29 NOTE — Telephone Encounter (Signed)
-----   Message from Abbie Sons, MD sent at 04/29/2018  2:48 PM EDT ----- Will you send in an Rx for Septra DS 1 twice daily x5 days and have him take his first dose tonight?  Thanks

## 2018-04-29 NOTE — Telephone Encounter (Signed)
Notified patient of positive urine culture & script sent to pharmacy. Will proceed with surgery 04/30/2018 per Dr Bernardo Heater. Patient voices understanding.

## 2018-04-30 ENCOUNTER — Ambulatory Visit: Payer: Medicare Other | Admitting: Certified Registered Nurse Anesthetist

## 2018-04-30 ENCOUNTER — Ambulatory Visit
Admission: RE | Admit: 2018-04-30 | Discharge: 2018-04-30 | Disposition: A | Discharge status: Home / Self Care | Payer: Medicare Other | Source: Ambulatory Visit | Attending: Urology | Admitting: Urology

## 2018-04-30 ENCOUNTER — Encounter
Admission: RE | Disposition: A | Discharge status: Home / Self Care | Payer: Self-pay | Source: Ambulatory Visit | Attending: Urology

## 2018-04-30 ENCOUNTER — Other Ambulatory Visit: Payer: Self-pay

## 2018-04-30 ENCOUNTER — Encounter: Payer: Self-pay | Admitting: *Deleted

## 2018-04-30 DIAGNOSIS — I10 Essential (primary) hypertension: Secondary | ICD-10-CM | POA: Insufficient documentation

## 2018-04-30 DIAGNOSIS — R310 Gross hematuria: Secondary | ICD-10-CM | POA: Diagnosis not present

## 2018-04-30 DIAGNOSIS — N368 Other specified disorders of urethra: Secondary | ICD-10-CM | POA: Insufficient documentation

## 2018-04-30 DIAGNOSIS — F329 Major depressive disorder, single episode, unspecified: Secondary | ICD-10-CM | POA: Insufficient documentation

## 2018-04-30 DIAGNOSIS — Z87891 Personal history of nicotine dependence: Secondary | ICD-10-CM | POA: Insufficient documentation

## 2018-04-30 DIAGNOSIS — K219 Gastro-esophageal reflux disease without esophagitis: Secondary | ICD-10-CM | POA: Diagnosis not present

## 2018-04-30 DIAGNOSIS — Z8501 Personal history of malignant neoplasm of esophagus: Secondary | ICD-10-CM | POA: Diagnosis not present

## 2018-04-30 DIAGNOSIS — D49511 Neoplasm of unspecified behavior of right kidney: Secondary | ICD-10-CM

## 2018-04-30 DIAGNOSIS — E7800 Pure hypercholesterolemia, unspecified: Secondary | ICD-10-CM | POA: Insufficient documentation

## 2018-04-30 DIAGNOSIS — Z79899 Other long term (current) drug therapy: Secondary | ICD-10-CM | POA: Insufficient documentation

## 2018-04-30 DIAGNOSIS — Z8673 Personal history of transient ischemic attack (TIA), and cerebral infarction without residual deficits: Secondary | ICD-10-CM | POA: Insufficient documentation

## 2018-04-30 DIAGNOSIS — I4891 Unspecified atrial fibrillation: Secondary | ICD-10-CM | POA: Insufficient documentation

## 2018-04-30 DIAGNOSIS — Z7901 Long term (current) use of anticoagulants: Secondary | ICD-10-CM | POA: Insufficient documentation

## 2018-04-30 DIAGNOSIS — N2889 Other specified disorders of kidney and ureter: Secondary | ICD-10-CM

## 2018-04-30 HISTORY — PX: CYSTOSCOPY W/ RETROGRADES: SHX1426

## 2018-04-30 HISTORY — PX: TRANSURETHRAL RESECTION OF BLADDER TUMOR: SHX2575

## 2018-04-30 SURGERY — CYSTOSCOPY, WITH RETROGRADE PYELOGRAM
Anesthesia: General | Site: Ureter | Wound class: Clean Contaminated

## 2018-04-30 MED ORDER — PROPOFOL 10 MG/ML IV BOLUS
INTRAVENOUS | Status: AC
  Filled 2018-04-30: qty 20

## 2018-04-30 MED ORDER — MIDAZOLAM HCL 2 MG/2ML IJ SOLN
INTRAMUSCULAR | Status: DC | PRN
  Administered 2018-04-30: 2 mg via INTRAVENOUS

## 2018-04-30 MED ORDER — DEXAMETHASONE SODIUM PHOSPHATE 10 MG/ML IJ SOLN
INTRAMUSCULAR | Status: DC | PRN
  Administered 2018-04-30: 4 mg via INTRAVENOUS

## 2018-04-30 MED ORDER — OXYCODONE HCL 5 MG PO TABS: 5.0000 mg | ORAL_TABLET | Freq: Once | ORAL | Status: DC | PRN

## 2018-04-30 MED ORDER — PROMETHAZINE HCL 25 MG/ML IJ SOLN: 6.2500 mg | INTRAMUSCULAR | Status: DC | PRN

## 2018-04-30 MED ORDER — GLYCOPYRROLATE 0.2 MG/ML IJ SOLN
INTRAMUSCULAR | Status: DC | PRN
  Administered 2018-04-30: 0.2 mg via INTRAVENOUS

## 2018-04-30 MED ORDER — DEXAMETHASONE SODIUM PHOSPHATE 10 MG/ML IJ SOLN
INTRAMUSCULAR | Status: AC
  Filled 2018-04-30: qty 1

## 2018-04-30 MED ORDER — IOTHALAMATE MEGLUMINE 43 % IV SOLN
INTRAVENOUS | Status: DC | PRN
  Administered 2018-04-30: 15 mL via URETHRAL

## 2018-04-30 MED ORDER — LIDOCAINE HCL (CARDIAC) PF 100 MG/5ML IV SOSY
PREFILLED_SYRINGE | INTRAVENOUS | Status: DC | PRN
  Administered 2018-04-30: 100 mg via INTRAVENOUS

## 2018-04-30 MED ORDER — PHENYLEPHRINE HCL 10 MG/ML IJ SOLN
INTRAMUSCULAR | Status: DC | PRN
  Administered 2018-04-30: 50 ug via INTRAVENOUS
  Administered 2018-04-30 (×2): 100 ug via INTRAVENOUS
  Administered 2018-04-30 (×2): 50 ug via INTRAVENOUS
  Administered 2018-04-30: 100 ug via INTRAVENOUS

## 2018-04-30 MED ORDER — CEFAZOLIN SODIUM-DEXTROSE 2-4 GM/100ML-% IV SOLN
INTRAVENOUS | Status: AC
  Filled 2018-04-30: qty 100

## 2018-04-30 MED ORDER — ONDANSETRON HCL 4 MG/2ML IJ SOLN
INTRAMUSCULAR | Status: AC
  Filled 2018-04-30: qty 2

## 2018-04-30 MED ORDER — ONDANSETRON HCL 4 MG/2ML IJ SOLN
INTRAMUSCULAR | Status: DC | PRN
  Administered 2018-04-30: 4 mg via INTRAVENOUS

## 2018-04-30 MED ORDER — LIDOCAINE HCL (PF) 2 % IJ SOLN
INTRAMUSCULAR | Status: AC
  Filled 2018-04-30: qty 10

## 2018-04-30 MED ORDER — FENTANYL CITRATE (PF) 100 MCG/2ML IJ SOLN
INTRAMUSCULAR | Status: DC | PRN
  Administered 2018-04-30: 50 ug via INTRAVENOUS

## 2018-04-30 MED ORDER — PROPOFOL 10 MG/ML IV BOLUS
INTRAVENOUS | Status: DC | PRN
  Administered 2018-04-30: 150 mg via INTRAVENOUS
  Administered 2018-04-30: 20 mg via INTRAVENOUS

## 2018-04-30 MED ORDER — MIDAZOLAM HCL 2 MG/2ML IJ SOLN
INTRAMUSCULAR | Status: AC
  Filled 2018-04-30: qty 2

## 2018-04-30 MED ORDER — EPHEDRINE SULFATE 50 MG/ML IJ SOLN
INTRAMUSCULAR | Status: AC
  Filled 2018-04-30: qty 1

## 2018-04-30 MED ORDER — OXYCODONE HCL 5 MG/5ML PO SOLN: 5.0000 mg | Freq: Once | ORAL | Status: DC | PRN

## 2018-04-30 MED ORDER — GLYCOPYRROLATE 0.2 MG/ML IJ SOLN
INTRAMUSCULAR | Status: AC
  Filled 2018-04-30: qty 1

## 2018-04-30 MED ORDER — MEPERIDINE HCL 50 MG/ML IJ SOLN: 6.2500 mg | INTRAMUSCULAR | Status: DC | PRN

## 2018-04-30 MED ORDER — FENTANYL CITRATE (PF) 100 MCG/2ML IJ SOLN
INTRAMUSCULAR | Status: AC
  Filled 2018-04-30: qty 2

## 2018-04-30 MED ORDER — LACTATED RINGERS IV SOLN
INTRAVENOUS | Status: DC
  Administered 2018-04-30: 10:00:00 via INTRAVENOUS

## 2018-04-30 MED ORDER — FENTANYL CITRATE (PF) 100 MCG/2ML IJ SOLN: 25.0000 ug | INTRAMUSCULAR | Status: DC | PRN

## 2018-04-30 MED ORDER — EPHEDRINE SULFATE 50 MG/ML IJ SOLN
INTRAMUSCULAR | Status: DC | PRN
  Administered 2018-04-30: 10 mg via INTRAVENOUS

## 2018-04-30 SURGICAL SUPPLY — 34 items
BAG DRAIN CYSTO-URO LG1000N (MISCELLANEOUS) ×4 IMPLANT
BAG URINE DRAINAGE (UROLOGICAL SUPPLIES) ×4 IMPLANT
BRUSH SCRUB EZ  4% CHG (MISCELLANEOUS) ×2
BRUSH SCRUB EZ 4% CHG (MISCELLANEOUS) ×2 IMPLANT
CATH FOL 2WAY LX 18X30 (CATHETERS) ×4 IMPLANT
CATH FOLEY 2WAY  5CC 16FR (CATHETERS) ×2
CATH URETL 5X70 OPEN END (CATHETERS) ×4 IMPLANT
CATH URTH 16FR FL 2W BLN LF (CATHETERS) ×2 IMPLANT
CONRAY 43 FOR UROLOGY 50M (MISCELLANEOUS) ×4 IMPLANT
DRAPE UTILITY 15X26 TOWEL STRL (DRAPES) ×4 IMPLANT
DRSG TELFA 4X3 1S NADH ST (GAUZE/BANDAGES/DRESSINGS) ×4 IMPLANT
ELECT LOOP 22F BIPOLAR SML (ELECTROSURGICAL) ×4
ELECT REM PT RETURN 9FT ADLT (ELECTROSURGICAL)
ELECTRODE LOOP 22F BIPOLAR SML (ELECTROSURGICAL) ×2 IMPLANT
ELECTRODE REM PT RTRN 9FT ADLT (ELECTROSURGICAL) IMPLANT
GLOVE BIO SURGEON STRL SZ8 (GLOVE) ×4 IMPLANT
GOWN STRL REUS W/ TWL LRG LVL3 (GOWN DISPOSABLE) ×4 IMPLANT
GOWN STRL REUS W/TWL LRG LVL3 (GOWN DISPOSABLE) ×4
GOWN STRL REUS W/TWL XL LVL4 (GOWN DISPOSABLE) ×4 IMPLANT
KIT TURNOVER CYSTO (KITS) ×4 IMPLANT
LOOP CUT BIPOLAR 24F LRG (ELECTROSURGICAL) IMPLANT
PACK CYSTO AR (MISCELLANEOUS) ×4 IMPLANT
SCRUB POVIDONE IODINE 4 OZ (MISCELLANEOUS) IMPLANT
SENSORWIRE 0.038 NOT ANGLED (WIRE) ×4
SET CYSTO W/LG BORE CLAMP LF (SET/KITS/TRAYS/PACK) ×4 IMPLANT
SET IRRIG Y TYPE TUR BLADDER L (SET/KITS/TRAYS/PACK) ×4 IMPLANT
SET IRRIGATING DISP (SET/KITS/TRAYS/PACK) ×4 IMPLANT
SOL .9 NS 3000ML IRR  AL (IV SOLUTION) ×2
SOL .9 NS 3000ML IRR UROMATIC (IV SOLUTION) ×2 IMPLANT
SURGILUBE 2OZ TUBE FLIPTOP (MISCELLANEOUS) ×4 IMPLANT
SYR 10ML LL (SYRINGE) ×4 IMPLANT
SYRINGE IRR TOOMEY STRL 70CC (SYRINGE) ×4 IMPLANT
WATER STERILE IRR 1000ML POUR (IV SOLUTION) ×4 IMPLANT
WIRE SENSOR 0.038 NOT ANGLED (WIRE) ×2 IMPLANT

## 2018-04-30 NOTE — OR Nursing (Signed)
Spouse advises MD instructed her on when pt should resume Elaquis

## 2018-04-30 NOTE — Anesthesia Preprocedure Evaluation (Signed)
Anesthesia Evaluation  Patient identified by MRN, date of birth, ID band Patient awake    Reviewed: Allergy & Precautions, NPO status , Patient's Chart, lab work & pertinent test results  History of Anesthesia Complications Negative for: history of anesthetic complications  Airway Mallampati: II  TM Distance: >3 FB Neck ROM: Full    Dental no notable dental hx.    Pulmonary neg sleep apnea, neg COPD, former smoker,    breath sounds clear to auscultation- rhonchi (-) wheezing      Cardiovascular Exercise Tolerance: Good hypertension, Pt. on medications (-) CAD, (-) Past MI, (-) Cardiac Stents and (-) CABG  Rhythm:Regular Rate:Normal - Systolic murmurs and - Diastolic murmurs    Neuro/Psych PSYCHIATRIC DISORDERS Depression negative neurological ROS     GI/Hepatic Neg liver ROS, GERD  ,  Endo/Other  negative endocrine ROSneg diabetes  Renal/GU Renal InsufficiencyRenal disease     Musculoskeletal  (+) Arthritis ,   Abdominal (+) - obese,   Peds  Hematology negative hematology ROS (+)   Anesthesia Other Findings Past Medical History: No date: A-fib (HCC) No date: Arthritis     Comment:  RIGHT KNEE No date: Barrett's esophagus with dysplasia     Comment:  resection 2011 No date: BPH (benign prostatic hyperplasia) No date: Cancer [facility])     Comment:  January 2001 Esophageal Cancer No date: Chronic kidney disease No date: Colon polyps No date: Depression No date: Dysrhythmia     Comment:  A-FIB,TRANSIENT No date: Elevated PSA 01/04/2018: Genetic testing     Comment:  Multi-Cancer panel (83 genes) @ [facility] - No pathogenic               mutations detected No date: GERD (gastroesophageal reflux disease) No date: History of esophageal cancer No date: Hypercholesteremia No date: Hypertension No date: Hypertrophy of prostate without urinary obstruction No date: Knee pain, bilateral No date: Shoulder pain,  left No date: Stroke [facility])   Reproductive/Obstetrics                             Anesthesia Physical Anesthesia Plan  ASA: III  Anesthesia Plan: General   Post-op Pain Management:    Induction: Intravenous  PONV Risk Score and Plan: 1 and Ondansetron and Midazolam  Airway Management Planned: LMA  Additional Equipment:   Intra-op Plan:   Post-operative Plan:   Informed Consent: I have reviewed the patients History and Physical, chart, labs and discussed the procedure including the risks, benefits and alternatives for the proposed anesthesia with the patient or authorized representative who has indicated his/her understanding and acceptance.   Dental advisory given  Plan Discussed with: CRNA and Anesthesiologist  Anesthesia Plan Comments:         Anesthesia Quick Evaluation

## 2018-04-30 NOTE — Anesthesia Post-op Follow-up Note (Signed)
Anesthesia QCDR form completed.        

## 2018-04-30 NOTE — OR Nursing (Signed)
NOTIFIED DR STOIFF RE FOLEY CATH URINE DARK [place]MEL[place] COLOR.  ADVISES FOR [place] TO SEND PT HOME WITH FOLEY CATH AND HAVE HIM COME TO OFFICE IN AM FOR FOLEY REMOVAL.

## 2018-04-30 NOTE — Anesthesia Postprocedure Evaluation (Signed)
Anesthesia Post Note  Patient: Blake Jenkins  Procedure(s) Performed: CYSTOSCOPY WITH RETROGRADE PYELOGRAM (Bilateral Ureter) TRANSURETHRAL RESECTION OF BLADDER TUMOR (TURBT) (N/A Bladder)  Patient location during evaluation: PACU Anesthesia Type: General Level of consciousness: awake and alert and oriented Pain management: pain level controlled Vital Signs Assessment: post-procedure vital signs reviewed and stable Respiratory status: spontaneous breathing, nonlabored ventilation and respiratory function stable Cardiovascular status: blood pressure returned to baseline and stable Postop Assessment: no signs of nausea or vomiting Anesthetic complications: no     Last Vitals:  Vitals:   04/30/18 1148 04/30/18 1157  BP:  121/85  Pulse: 72 69  Resp: 13 11  Temp:    SpO2: 100% 99%    Last Pain:  Vitals:   04/30/18 1157  TempSrc:   PainSc: 0-No pain                 Lessly Stigler

## 2018-04-30 NOTE — Discharge Instructions (Signed)

## 2018-04-30 NOTE — Anesthesia Procedure Notes (Signed)
Procedure Name: LMA Insertion Date/Time: 04/30/2018 10:46 AM Performed by: Rudean Hitt, CRNA Pre-anesthesia Checklist: Patient identified, Patient being monitored, Timeout performed, Emergency Drugs available and Suction available Patient Re-evaluated:Patient Re-evaluated prior to induction Oxygen Delivery Method: Circle system utilized Preoxygenation: Pre-oxygenation with 100% oxygen Induction Type: IV induction Ventilation: Mask ventilation without difficulty LMA: LMA inserted LMA Size: 4.0 Tube type: Oral Number of attempts: 1 Placement Confirmation: positive ETCO2 and breath sounds checked- equal and bilateral Tube secured with: Tape Dental Injury: Teeth and Oropharynx as per pre-operative assessment

## 2018-04-30 NOTE — Interval H&P Note (Signed)
History and Physical Interval Note:  04/30/2018 10:32 AM  Blake Jenkins  has presented today for surgery, with the diagnosis of Hematuria  The various methods of treatment have been discussed with the patient and family. After consideration of risks, benefits and other options for treatment, the patient has consented to  Procedure(s): CYSTOSCOPY WITH RETROGRADE PYELOGRAM (Bilateral) TRANSURETHRAL RESECTION OF BLADDER TUMOR (TURBT) (N/A) as a surgical intervention .  The patient's history has been reviewed, patient examined, no change in status, stable for surgery.  I have reviewed the patient's chart and labs.  Questions were answered to the patient's satisfaction.     [place]

## 2018-04-30 NOTE — H&P (Signed)
04/30/2018 10:29 AM   Blake Jenkins [birth year] [id number]  Referring provider: No referring provider defined for this encounter.   HPI: 77 year old male with a history of gross hematuria.  Renal ultrasound was unremarkable.  Cystoscopy showed suboptimal visualization.  There was some inflammatory tissue in the region of the prostate/bladder neck.  He is scheduled for bilateral retrograde pyelograms and cystoscopy under anesthesia with possible bladder biopsy/TURBT   PMH: Past Medical History:  Diagnosis Date  . A-fib ([place])   . Arthritis    RIGHT KNEE  . Barrett's esophagus with dysplasia    resection 2011  . BPH (benign prostatic hyperplasia)   . Cancer [facility])    January 2001 Esophageal Cancer  . Chronic kidney disease   . Colon polyps   . Depression   . Dysrhythmia    A-FIB,TRANSIENT  . Elevated PSA   . Genetic testing 01/04/2018   Multi-Cancer panel (83 genes) @ [facility] - No pathogenic mutations detected  . GERD (gastroesophageal reflux disease)   . History of esophageal cancer   . Hypercholesteremia   . Hypertension   . Hypertrophy of prostate without urinary obstruction   . Knee pain, bilateral   . Shoulder pain, left   . Stroke [facility])     Surgical History: Past Surgical History:  Procedure Laterality Date  . ADENOIDECTOMY    . COLONOSCOPY WITH PROPOFOL N/A 01/30/2017   Procedure: COLONOSCOPY WITH PROPOFOL;  Surgeon: Lollie Sails, MD;  Location: [facility] ENDOSCOPY;  Service: Endoscopy;  Laterality: N/A;  . COLONOSCOPY WITH PROPOFOL N/A 12/20/2017   Procedure: COLONOSCOPY WITH PROPOFOL;  Surgeon: Lollie Sails, MD;  Location[facility] ENDOSCOPY;  Service: Endoscopy;  Laterality: N/A;  . ESOPHAGEAL RESECTION    . ESOPHAGOGASTRODUODENOSCOPY (EGD) WITH PROPOFOL N/A 03/04/2018   Procedure: ESOPHAGOGASTRODUODENOSCOPY (EGD) WITH PROPOFOL;  Surgeon: Lollie Sails, MD;  Location: [facility] ENDOSCOPY;  Service: Endoscopy;  Laterality: N/A;  . ESOPHAGOGASTRODUODENOSCOPY (EGD)  WITH PROPOFOL N/A 04/04/2018   Procedure: ESOPHAGOGASTRODUODENOSCOPY (EGD) WITH PROPOFOL;  Surgeon: Lollie Sails, MD;  Location: [facility] ENDOSCOPY;  Service: Endoscopy;  Laterality: N/A;  . KNEE ARTHROSCOPY Left   . PROSTATE SURGERY    . TONSILLECTOMY    . TURP VAPORIZATION  05/10/2011    Home Medications:  . pantoprazole (PROTONIX) 40 MG tablet Take 40 mg by mouth 2 (two) times daily.     . sucralfate (CARAFATE) 1 g tablet Take 1 g by mouth 4 (four) times daily -  with meals and at bedtime.     Marland Kitchen VITAMIN D, CHOLECALCIFEROL, PO Take 2,000 Units by mouth 2 (two) times daily.     Marland Kitchen apixaban (ELIQUIS) 5 MG TABS tablet Take 5 mg by mouth 2 (two) times daily.   03/31/2018  . atorvastatin (LIPITOR) 20 MG tablet Take 20 mg by mouth daily.   03/03/2018 at 2100  . lisinopril (PRINIVIL,ZESTRIL) 5 MG tablet Take 5 mg by mouth daily.   03/03/2018 at 2100  . omeprazole (PRILOSEC) 40 MG capsule Take 40 mg by mouth daily.   03/03/2018 at 2100  . polyethylene glycol powder (GLYCOLAX/MIRALAX) powder Take 1 Container by mouth once.   Not Taking at Unknown time  . sertraline (ZOLOFT) 100 MG tablet Take 100 mg by mouth daily.   03/03/2018 at 1900  . sildenafil (VIAGRA) 100 MG tablet Take 100 mg by mouth daily as needed for erectile dysfunction.   Past Month at Unknown time     Allergies: No Known Allergies  Family History: Family History  Problem Relation Age of Onset  . Coronary artery disease Mother   . Alcohol abuse Father   . Prostate cancer Neg Hx   . Bladder Cancer Neg Hx   . Kidney cancer Neg Hx     Social History:  reports that he quit smoking about 46 years ago. His smoking use included cigarettes. He smoked 1.00 pack per day. He has never used smokeless tobacco. He reports that he does not drink alcohol or use drugs.  ROS: No significant change from 03/01/2018   Physical Exam: BP (!) 134/96   Pulse 63   Temp (!) 97.3 F (36.3 C) (Temporal)   Resp 16   Ht 5\' 9"   (1.753 m)   Wt 71.5 kg   SpO2 100%   BMI 23.28 kg/m   Constitutional:  Alert and oriented, No acute distress. HEENT: [place] AT, moist mucus membranes.  Trachea midline, no masses. Cardiovascular: No clubbing, cyanosis, or edema.  RRR Respiratory: Normal respiratory effort, no increased work of breathing.  Lungs clear GI: Abdomen is soft, nontender, nondistended, no abdominal masses GU: No CVA tenderness Lymph: No cervical or inguinal lymphadenopathy. Skin: No rashes, bruises or suspicious lesions. Neurologic: Grossly intact, no focal deficits, moving all 4 extremities. Psychiatric: Normal mood and affect.  Assessment & Plan:   77 year old male with an isolated episode of total gross painless hematuria and suboptimal visualization on cystoscopy.  He is scheduled for cystoscopy under anesthesia with bilateral retrograde pyelograms and possible bladder biopsy/TURBT.  The procedure was discussed in detail including potential risks of bleeding, infection and bladder injury.  His Eliquis has been held.   Abbie Sons, [place] [address], [place], [place] [address] [phone]

## 2018-04-30 NOTE — Transfer of Care (Signed)
Immediate Anesthesia Transfer of Care Note  Patient: Gregor Dershem Seal  Procedure(s) Performed: CYSTOSCOPY WITH RETROGRADE PYELOGRAM (Bilateral Ureter) TRANSURETHRAL RESECTION OF BLADDER TUMOR (TURBT) (N/A Bladder)  Patient Location: PACU  Anesthesia Type:General  Level of Consciousness: drowsy  Airway & Oxygen Therapy: Patient Spontanous Breathing and Patient connected to face mask oxygen  Post-op Assessment: Report given to RN and Post -op Vital signs reviewed and stable  Post vital signs: Reviewed and stable  Last Vitals:  Vitals Value Taken Time  BP 116/79 04/30/2018 11:27 AM  Temp 36.9 C 04/30/2018 11:27 AM  Pulse 70 04/30/2018 11:30 AM  Resp 13 04/30/2018 11:30 AM  SpO2 100 % 04/30/2018 11:30 AM  Vitals shown include unvalidated device data.  Last Pain:  Vitals:   04/30/18 1127  TempSrc:   PainSc: Asleep         Complications: No apparent anesthesia complications

## 2018-05-01 ENCOUNTER — Ambulatory Visit: Payer: Medicare Other

## 2018-05-01 DIAGNOSIS — R310 Gross hematuria: Secondary | ICD-10-CM

## 2018-05-01 LAB — SURGICAL PATHOLOGY

## 2018-05-01 NOTE — Op Note (Signed)
Preoperative diagnosis:  1. Gross hematuria with abnormal cystoscopy  Postoperative diagnosis:  1. Gross hematuria with abnormal cystoscopy  Procedure: 1. Cystoscopy 2. Transurethral biopsies of the prostatic urethra 3. Bilateral retrograde pyelography with interpretation  Surgeon: Abbie Sons, MD  Anesthesia: General  Complications: None  Intraoperative findings:  1.  Cystoscopic findings-urethra normal in caliber without stricture.  Prostatic urethra with TUR defect and inflammatory tissue primarily on the left and bladder neck region.  Bladder neck was elevated.  The bladder mucosa was normal in appearance without erythema, solid or papillary lesions.  Ureteral orifices normal appearing bilaterally with clear efflux.  Mild trabeculation noted.  2.  Bilateral retrograde pyelography-right and left ureters were normal in caliber without stricture, dilation or filling defects.  Collecting systems normal appearing bilaterally without filling defects, dilation.  Collecting systems delicate bilaterally.  EBL: Minimal  Specimens: None  Indication: Blake Jenkins is a 77 y.o. patient with an isolated episode of gross hematuria.  Renal ultrasound showed no abnormalities.  Office cystoscopy showed inflammatory appearing tissue within the prostatic urethra.  Visualization of the bladder was suboptimal.  After reviewing the management options for treatment, he elected to proceed with the above surgical procedure(s). We have discussed the potential benefits and risks of the procedure, side effects of the proposed treatment, the likelihood of the patient achieving the goals of the procedure, and any potential problems that might occur during the procedure or recuperation. Informed consent has been obtained.  Description of procedure:  The patient was taken to the operating room and general anesthesia was induced.  The patient was placed in the dorsal lithotomy position, prepped and draped in the  usual sterile fashion, and preoperative antibiotics were administered. A preoperative time-out was performed.   A 21 French cystoscope was lubricated and passed under direct vision with findings as described above.  There was a TUR defect with elevation of the bladder neck.  No significant abnormalities were noted in the bladder.  A 6 French open-ended ureteral catheter was placed to the cystoscope.  A 0.038 Sensor wire was placed through the ureteral catheter and into the left ureteral orifice.  The catheter was advanced over the wire and after wire removal retrograde pyelogram was performed with findings as described above.  An identical procedure was performed on the contralateral side.  The cystoscope was removed and a 26 French continuous-flow resectoscope sheath with obturator was placed without difficulty.  Transurethral prostatic biopsies were performed with the resectoscope.  Hemostasis was performed with cautery.  All tissue was removed and sent to pathology.  Hemostasis was noted to be adequate and the resectoscope was removed.  An 79 French Foley catheter was placed with return of clear effluent upon irrigation.  After anesthetic reversal he was transported to the PACU in stable condition.   Abbie Sons, M.D.

## 2018-05-01 NOTE — Progress Notes (Signed)
Catheter Removal  Patient is present today for a catheter removal.  81ml of water was drained from the balloon. A 16FR foley cath was removed from the bladder no complications were noted . Patient tolerated well.  Preformed by: Cristie Hem, CMA  Follow up/ Additional notes: As Scheduled

## 2018-05-09 ENCOUNTER — Ambulatory Visit: Payer: Medicare Other

## 2018-05-09 ENCOUNTER — Other Ambulatory Visit: Payer: Self-pay

## 2018-05-09 VITALS — BP 127/80 | HR 66 | Ht 69.0 in | Wt 154.2 lb

## 2018-05-09 DIAGNOSIS — R310 Gross hematuria: Secondary | ICD-10-CM

## 2018-05-09 LAB — MICROSCOPIC EXAMINATION
Epithelial Cells (non renal): NONE SEEN /hpf (ref 0–10)
RBC, UA: >30 /hpf — ABNORMAL HIGH (ref 0–2)

## 2018-05-09 LAB — URINALYSIS, COMPLETE
Bilirubin, UA: NEGATIVE
Nitrite, UA: POSITIVE — AB
Specific Gravity, UA: <1.005 — ABNORMAL LOW (ref 1.005–1.030)
Urobilinogen, Ur: >8 mg/dL — ABNORMAL HIGH (ref 0.2–1.0)

## 2018-05-09 MED ORDER — SULFAMETHOXAZOLE-TRIMETHOPRIM 800-160 MG PO TABS: 1.0000 | ORAL_TABLET | Freq: Two times a day (BID) | ORAL | 0 refills | Status: AC

## 2018-05-09 NOTE — Progress Notes (Addendum)
Pt here for nurse visit regarding post operative sx's. He has been experiencing gross hematuria with clots. Discussed with pt in full the recovery of his operation and how hematuria and clots is most likely from the healing process. Pt was instructed to dramatically increase his water intake from 2 bottles to 6 daily. He did provide a urine sample for analysis and culture. Pt will call if sx's worsen. Urine does appear to show infection. Pt started on Septra DS bid, 5 days.

## 2018-05-09 NOTE — Addendum Note (Signed)
Addended by: Garnette Gunner on: 05/09/2018 04:54 PM   Modules accepted: Orders

## 2018-05-12 LAB — CULTURE, URINE COMPREHENSIVE

## 2018-05-24 ENCOUNTER — Ambulatory Visit (INDEPENDENT_AMBULATORY_CARE_PROVIDER_SITE_OTHER): Payer: Medicare Other | Admitting: Urology

## 2018-05-24 ENCOUNTER — Encounter: Payer: Self-pay | Admitting: Urology

## 2018-05-24 VITALS — BP 111/72 | HR 61 | Ht 69.0 in | Wt 157.0 lb

## 2018-05-24 DIAGNOSIS — R310 Gross hematuria: Secondary | ICD-10-CM | POA: Diagnosis not present

## 2018-05-24 NOTE — Progress Notes (Signed)
05/24/2018 2:06 PM   Blake Jenkins May 16, 1941 [id number]  Referring provider: Clarisse Gouge, MD [address] [place], [place] [address]  Chief Complaint  Patient presents with  . Follow-up    CYSTO    HPI: 77 year old male presents for postop follow-up.  He had an episode of gross hematuria after starting Eliquis in June 2019.  Renal ultrasound showed no masses calculi or hydronephrosis.  CT urogram could not be performed secondary to CKD.  Office cystoscopy was suboptimal.  There is a area of bullous edema on the anterior and lateral walls of the bladder and friability and oozing from the prostatic urethra with prominent superficial vessels.  Cystoscopy under anesthesia was performed on 04/30/2018.  There was inflammatory tissue on the left prostatic urethra and bladder neck region.  No bladder mucosal lesions were identified on cystoscopy under anesthesia.  Biopsies were performed.  Bilateral retrograde pyelogram showed no abnormalities.  He had no postoperative complaints.  Pathology: All biopsies showed inflammatory tissue.   PMH: Past Medical History:  Diagnosis Date  . A-fib ([place])   . Arthritis    RIGHT KNEE  . Barrett's esophagus with dysplasia    resection 2011  . BPH (benign prostatic hyperplasia)   . Cancer [facility])    January 2001 Esophageal Cancer  . Chronic kidney disease   . Colon polyps   . Depression   . Dysrhythmia    A-FIB,TRANSIENT  . Elevated PSA   . Genetic testing 01/04/2018   Multi-Cancer panel (83 genes) @ [facility] - No pathogenic mutations detected  . GERD (gastroesophageal reflux disease)   . History of esophageal cancer   . Hypercholesteremia   . Hypertension   . Hypertrophy of prostate without urinary obstruction   . Knee pain, bilateral   . Shoulder pain, left   . Stroke [facility])     Surgical History: Past Surgical History:  Procedure Laterality Date  . ADENOIDECTOMY    . COLONOSCOPY WITH PROPOFOL N/A 01/30/2017   Procedure: COLONOSCOPY WITH PROPOFOL;  Surgeon: Lollie Sails, MD;  Location: [facility] ENDOSCOPY;  Service: Endoscopy;  Laterality: N/A;  . COLONOSCOPY WITH PROPOFOL N/A 12/20/2017   Procedure: COLONOSCOPY WITH PROPOFOL;  Surgeon: Lollie Sails, MD;  Location: [facility] ENDOSCOPY;  Service: Endoscopy;  Laterality: N/A;  . CYSTOSCOPY W/ RETROGRADES Bilateral 04/30/2018   Procedure: CYSTOSCOPY WITH RETROGRADE PYELOGRAM;  Surgeon: Abbie Sons, MD;  Location: [facility] ORS;  Service: Urology;  Laterality: Bilateral;  . ESOPHAGEAL RESECTION    . ESOPHAGOGASTRODUODENOSCOPY (EGD) WITH PROPOFOL N/A 03/04/2018   Procedure: ESOPHAGOGASTRODUODENOSCOPY (EGD) WITH PROPOFOL;  Surgeon: Lollie Sails, MD;  Location: [facility] ENDOSCOPY;  Service: Endoscopy;  Laterality: N/A;  . ESOPHAGOGASTRODUODENOSCOPY (EGD) WITH PROPOFOL N/A 04/04/2018   Procedure: ESOPHAGOGASTRODUODENOSCOPY (EGD) WITH PROPOFOL;  Surgeon: Lollie Sails, MD;  Location: [facility] ENDOSCOPY;  Service: Endoscopy;  Laterality: N/A;  . KNEE ARTHROSCOPY Left   . PROSTATE SURGERY    . TONSILLECTOMY    . TRANSURETHRAL RESECTION OF BLADDER TUMOR N/A 04/30/2018   Procedure: TRANSURETHRAL RESECTION OF BLADDER TUMOR (TURBT);  Surgeon: Abbie Sons, MD;  Location: [facility] ORS;  Service: Urology;  Laterality: N/A;  . TURP VAPORIZATION  05/10/2011    Home Medications:  Allergies as of 05/24/2018   No Known Allergies     Medication List        Accurate as of 05/24/18  2:06 PM. Always use your most recent med list.  atorvastatin 20 MG tablet Commonly known as:  LIPITOR Take 20 mg by mouth every evening.   calcium carbonate 500 MG chewable tablet Commonly known as:  TUMS - dosed in mg elemental calcium Chew 1 tablet by mouth daily as needed for indigestion or heartburn.   ELIQUIS 5 MG Tabs tablet Generic drug:  apixaban Take 5 mg by mouth 2 (two) times daily.   lisinopril 5 MG tablet Commonly known as:  PRINIVIL,ZESTRIL Take 5 mg by  mouth every evening.   pantoprazole 40 MG tablet Commonly known as:  PROTONIX Take 40 mg by mouth 2 (two) times daily.   sertraline 100 MG tablet Commonly known as:  ZOLOFT Take 100 mg by mouth every evening.   sildenafil 100 MG tablet Commonly known as:  VIAGRA Take 100 mg by mouth daily as needed for erectile dysfunction.   sulfamethoxazole-trimethoprim 800-160 MG tablet Commonly known as:  BACTRIM DS,SEPTRA DS Take 1 tablet by mouth every 12 (twelve) hours.   Vitamin D 2000 units tablet Take 4,000 Units by mouth at bedtime.       Allergies: No Known Allergies  Family History: Family History  Problem Relation Age of Onset  . Coronary artery disease Mother   . Alcohol abuse Father   . Prostate cancer Neg Hx   . Bladder Cancer Neg Hx   . Kidney cancer Neg Hx     Social History:  reports that he quit smoking about 46 years ago. His smoking use included cigarettes. He smoked 1.00 pack per day. He has never used smokeless tobacco. He reports that he does not drink alcohol or use drugs.  ROS: UROLOGY Frequent Urination?: No Hard to postpone urination?: No Burning/pain with urination?: No Get up at night to urinate?: No Leakage of urine?: No Urine stream starts and stops?: No Trouble starting stream?: No Do you have to strain to urinate?: No Blood in urine?: No Urinary tract infection?: No Sexually transmitted disease?: No Injury to kidneys or bladder?: No Painful intercourse?: No Weak stream?: No Erection problems?: No Penile pain?: No  Gastrointestinal Nausea?: No Vomiting?: No Indigestion/heartburn?: No Diarrhea?: No Constipation?: No  Constitutional Fever: No Night sweats?: No Weight loss?: No Fatigue?: No  Skin Skin rash/lesions?: No Itching?: No  Eyes Blurred vision?: No Double vision?: No  Ears/Nose/Throat Sore throat?: No Sinus problems?: No  Hematologic/Lymphatic Swollen glands?: No Easy bruising?: No  Cardiovascular Leg  swelling?: No Chest pain?: No  Respiratory Cough?: No Shortness of breath?: No  Endocrine Excessive thirst?: No  Musculoskeletal Back pain?: No Joint pain?: No  Neurological Headaches?: No Dizziness?: No  Psychologic Depression?: No Anxiety?: No  Physical Exam: BP 111/72   Pulse 61   Ht 5\' 9"  (1.753 m)   Wt 157 lb (71.2 kg)   BMI 23.18 kg/m   Constitutional:  Alert and oriented, No acute distress.   Assessment & Plan:   Biopsy of prostatic urethra showed inflammatory tissue.  His hematuria was most likely prostatic in origin.  Recommend follow-up 6 months and he was instructed to call earlier for recurrent gross hematuria.   Abbie Sons, [place] [address], [place], [place] [address] [phone]

## 2018-05-27 ENCOUNTER — Encounter: Payer: Self-pay | Admitting: Urology

## 2018-11-22 ENCOUNTER — Encounter: Payer: Self-pay | Admitting: Urology

## 2018-11-22 ENCOUNTER — Ambulatory Visit (INDEPENDENT_AMBULATORY_CARE_PROVIDER_SITE_OTHER): Payer: Medicare Other | Admitting: Urology

## 2018-11-22 VITALS — BP 135/85 | HR 74 | Ht 69.0 in | Wt 157.0 lb

## 2018-11-22 DIAGNOSIS — Z87448 Personal history of other diseases of urinary system: Secondary | ICD-10-CM

## 2018-11-22 NOTE — Progress Notes (Signed)
11/22/2018 1:26 PM   Blake Jenkins Nov 14, 1940 [id number]  Referring provider: Clarisse Gouge, MD [address] [place], [place] [address]  Chief Complaint  Patient presents with  . Hematuria   Urologic history: 1.  History gross hematuria  -Intermittent total gross painless hematuria June 2019 after starting Eliquis  -CTU unable to be performed secondary to chronic kidney disease  -Cystoscopy with bullous edema anterior/lateral bladder  -Cystoscopy with bilateral retrograde pyelogram/biopsies 04/2018  -Normal upper tracts on retrogrades; bladder mucosal changes had resolved; inflammatory tissue and prostatic urethra biopsy which returned edematous, inflamed, reactive urothelium with focal dystrophic calcification   HPI: 78 year old male presents for a six-month follow-up.  He denies recurrent gross hematuria and states he has been doing well.  He denies bothersome lower urinary tract symptoms.   PMH: Past Medical History:  Diagnosis Date  . A-fib ([place])   . Arthritis    RIGHT KNEE  . Barrett's esophagus with dysplasia    resection 2011  . BPH (benign prostatic hyperplasia)   . Cancer [facility])    January 2001 Esophageal Cancer  . Chronic kidney disease   . Colon polyps   . Depression   . Dysrhythmia    A-FIB,TRANSIENT  . Elevated PSA   . Genetic testing 01/04/2018   Multi-Cancer panel (83 genes) @ [facility] - No pathogenic mutations detected  . GERD (gastroesophageal reflux disease)   . History of esophageal cancer   . Hypercholesteremia   . Hypertension   . Hypertrophy of prostate without urinary obstruction   . Knee pain, bilateral   . Shoulder pain, left   . Stroke [facility])     Surgical History: Past Surgical History:  Procedure Laterality Date  . ADENOIDECTOMY    . COLONOSCOPY WITH PROPOFOL N/A 01/30/2017   Procedure: COLONOSCOPY WITH PROPOFOL;  Surgeon: Lollie Sails, MD;  Location[facility] ENDOSCOPY;  Service: Endoscopy;  Laterality:  N/A;  . COLONOSCOPY WITH PROPOFOL N/A 12/20/2017   Procedure: COLONOSCOPY WITH PROPOFOL;  Surgeon: Lollie Sails, MD;  Location: [facility] ENDOSCOPY;  Service: Endoscopy;  Laterality: N/A;  . CYSTOSCOPY W/ RETROGRADES Bilateral 04/30/2018   Procedure: CYSTOSCOPY WITH RETROGRADE PYELOGRAM;  Surgeon: Abbie Sons, MD;  Location: [facility] ORS;  Service: Urology;  Laterality: Bilateral;  . ESOPHAGEAL RESECTION    . ESOPHAGOGASTRODUODENOSCOPY (EGD) WITH PROPOFOL N/A 03/04/2018   Procedure: ESOPHAGOGASTRODUODENOSCOPY (EGD) WITH PROPOFOL;  Surgeon: Lollie Sails, MD;  Location: [facility] ENDOSCOPY;  Service: Endoscopy;  Laterality: N/A;  . ESOPHAGOGASTRODUODENOSCOPY (EGD) WITH PROPOFOL N/A 04/04/2018   Procedure: ESOPHAGOGASTRODUODENOSCOPY (EGD) WITH PROPOFOL;  Surgeon: Lollie Sails, MD;  Location: [facility] ENDOSCOPY;  Service: Endoscopy;  Laterality: N/A;  . KNEE ARTHROSCOPY Left   . PROSTATE SURGERY    . TONSILLECTOMY    . TRANSURETHRAL RESECTION OF BLADDER TUMOR N/A 04/30/2018   Procedure: TRANSURETHRAL RESECTION OF BLADDER TUMOR (TURBT);  Surgeon: Abbie Sons, MD;  Location: [facility] ORS;  Service: Urology;  Laterality: N/A;  . TURP VAPORIZATION  05/10/2011    Home Medications:  Allergies as of 11/22/2018   No Known Allergies     Medication List       Accurate as of November 22, 2018  1:26 PM. Always use your most recent med list.        atorvastatin 20 MG tablet Commonly known as:  LIPITOR Take 20 mg by mouth every evening.   calcium carbonate 500 MG chewable tablet Commonly known as:  TUMS - dosed in mg elemental calcium  Chew 1 tablet by mouth daily as needed for indigestion or heartburn.   ciclopirox 8 % solution Commonly known as:  PENLAC Apply topically.   Eliquis 5 MG Tabs tablet Generic drug:  apixaban Take 5 mg by mouth 2 (two) times daily.   pantoprazole 40 MG tablet Commonly known as:  PROTONIX Take 40 mg by mouth 2 (two) times daily.   sertraline 100 MG  tablet Commonly known as:  ZOLOFT Take 100 mg by mouth every evening.   sildenafil 100 MG tablet Commonly known as:  VIAGRA Take 100 mg by mouth daily as needed for erectile dysfunction.   Vitamin D 50 MCG (2000 UT) tablet Take 4,000 Units by mouth at bedtime.       Allergies: No Known Allergies  Family History: Family History  Problem Relation Age of Onset  . Coronary artery disease Mother   . Alcohol abuse Father   . Prostate cancer Neg Hx   . Bladder Cancer Neg Hx   . Kidney cancer Neg Hx     Social History:  reports that he quit smoking about 46 years ago. His smoking use included cigarettes. He smoked 1.00 pack per day. He has never used smokeless tobacco. He reports that he does not drink alcohol or use drugs.  ROS: UROLOGY Frequent Urination?: No Hard to postpone urination?: No Burning/pain with urination?: No Get up at night to urinate?: No Leakage of urine?: No Urine stream starts and stops?: No Trouble starting stream?: No Do you have to strain to urinate?: No Blood in urine?: No Urinary tract infection?: No Sexually transmitted disease?: No Injury to kidneys or bladder?: No Painful intercourse?: No Weak stream?: No Erection problems?: No Penile pain?: No  Gastrointestinal Nausea?: No Vomiting?: No Indigestion/heartburn?: No Diarrhea?: No Constipation?: No  Constitutional Fever: No Night sweats?: No Weight loss?: No Fatigue?: No  Skin Skin rash/lesions?: No Itching?: No  Eyes Blurred vision?: No Double vision?: No  Ears/Nose/Throat Sore throat?: No Sinus problems?: No  Hematologic/Lymphatic Swollen glands?: No Easy bruising?: No  Cardiovascular Leg swelling?: No Chest pain?: No  Respiratory Cough?: No Shortness of breath?: No  Endocrine Excessive thirst?: No  Musculoskeletal Back pain?: No Joint pain?: No  Neurological Headaches?: No Dizziness?: No  Psychologic Depression?: No Anxiety?: No  Physical Exam: BP  135/85 (BP Location: Left Arm, Patient Position: Sitting, Cuff Size: Normal)   Pulse 74   Ht 5\' 9"  (1.753 m)   Wt 157 lb (71.2 kg)   BMI 23.18 kg/m   Constitutional:  Alert and oriented, No acute distress. HEENT: [place] AT, moist mucus membranes.  Trachea midline, no masses. Cardiovascular: No clubbing, cyanosis, or edema. Respiratory: Normal respiratory effort, no increased work of breathing. Skin: No rashes, bruises or suspicious lesions. Neurologic: Grossly intact, no focal deficits, moving all 4 extremities. Psychiatric: Normal mood and affect.   Assessment & Plan:   78 year old male with a history of gross hematuria with negative transurethral prostatic urethral biopsies/retrograde pyelography.  Denies recurrent hematuria.  Follow-up as needed for recurrent bleeding.   Abbie Sons, [place] [address], [place], [place] [address] [phone]

## 2019-09-03 ENCOUNTER — Other Ambulatory Visit: Payer: Self-pay | Admitting: Neurosurgery

## 2019-09-03 DIAGNOSIS — M48062 Spinal stenosis, lumbar region with neurogenic claudication: Secondary | ICD-10-CM

## 2019-09-15 ENCOUNTER — Other Ambulatory Visit: Payer: Self-pay

## 2019-09-15 ENCOUNTER — Ambulatory Visit
Admission: RE | Admit: 2019-09-15 | Discharge: 2019-09-15 | Disposition: A | Discharge status: Home / Self Care | Payer: Medicare Other | Source: Ambulatory Visit | Attending: Neurosurgery | Admitting: Neurosurgery

## 2019-09-15 DIAGNOSIS — M48062 Spinal stenosis, lumbar region with neurogenic claudication: Secondary | ICD-10-CM

## 2020-04-28 ENCOUNTER — Ambulatory Visit (INDEPENDENT_AMBULATORY_CARE_PROVIDER_SITE_OTHER): Payer: Medicare Other | Admitting: Urology

## 2020-04-28 ENCOUNTER — Encounter: Payer: Self-pay | Admitting: Urology

## 2020-04-28 ENCOUNTER — Other Ambulatory Visit: Payer: Self-pay

## 2020-04-28 VITALS — BP 119/76 | HR 67 | Ht 69.0 in | Wt 165.0 lb

## 2020-04-28 DIAGNOSIS — R310 Gross hematuria: Secondary | ICD-10-CM | POA: Diagnosis not present

## 2020-04-28 NOTE — Progress Notes (Signed)
04/28/2020 9:52 AM   Blake Jenkins 1941-09-15 [id number]  Referring provider: Clarisse Gouge, MD [address] [place],  [place] [address]  Chief Complaint  Patient presents with  . Hematuria    Urologic history: 1.  History gross hematuria             -Intermittent total gross painless hematuria June 2019 after starting Eliquis             -CTU unable to be performed secondary to chronic kidney disease             -Cystoscopy with bullous edema anterior/lateral bladder             -Cystoscopy with bilateral retrograde pyelogram/biopsies 04/2018             -Normal upper tracts on retrogrades; bladder mucosal changes had resolved; inflammatory tissue and prostatic urethra biopsy which returned edematous, inflamed, reactive urothelium with focal dystrophic calcification  HPI: 79 y.o. male presents for evaluation of hematuria.   Onset gross hematuria 04/13/2020  Described blood as heavy with clots  No dysuria or clot retention  Had mild right flank pain  Seen [place] and started on cefdinir  UA grossly bloody urine culture negative  Hematuria resolved but recurred last week  Creatinine 02/03/2020 1.4  Remains on Eliquis with previous history as above   PMH: Past Medical History:  Diagnosis Date  . A-fib ([place])   . Arthritis    RIGHT KNEE  . Barrett's esophagus with dysplasia    resection 2011  . BPH (benign prostatic hyperplasia)   . Cancer [facility])    January 2001 Esophageal Cancer  . Chronic kidney disease   . Colon polyps   . Depression   . Dysrhythmia    A-FIB,TRANSIENT  . Elevated PSA   . Genetic testing 01/04/2018   Multi-Cancer panel (83 genes) @ [facility] - No pathogenic mutations detected  . GERD (gastroesophageal reflux disease)   . History of esophageal cancer   . Hypercholesteremia   . Hypertension   . Hypertrophy of prostate without urinary obstruction   . Knee pain, bilateral   . Shoulder pain, left     . Stroke [facility])     Surgical History: Past Surgical History:  Procedure Laterality Date  . ADENOIDECTOMY    . COLONOSCOPY WITH PROPOFOL N/A 01/30/2017   Procedure: COLONOSCOPY WITH PROPOFOL;  Surgeon: Lollie Sails, MD;  Location: [facility] ENDOSCOPY;  Service: Endoscopy;  Laterality: N/A;  . COLONOSCOPY WITH PROPOFOL N/A 12/20/2017   Procedure: COLONOSCOPY WITH PROPOFOL;  Surgeon: Lollie Sails, MD;  Location[facility] ENDOSCOPY;  Service: Endoscopy;  Laterality: N/A;  . CYSTOSCOPY W/ RETROGRADES Bilateral 04/30/2018   Procedure: CYSTOSCOPY WITH RETROGRADE PYELOGRAM;  Surgeon: Abbie Sons, MD;  Location: [facility] ORS;  Service: Urology;  Laterality: Bilateral;  . ESOPHAGEAL RESECTION    . ESOPHAGOGASTRODUODENOSCOPY (EGD) WITH PROPOFOL N/A 03/04/2018   Procedure: ESOPHAGOGASTRODUODENOSCOPY (EGD) WITH PROPOFOL;  Surgeon: Lollie Sails, MD;  Location: [facility] ENDOSCOPY;  Service: Endoscopy;  Laterality: N/A;  . ESOPHAGOGASTRODUODENOSCOPY (EGD) WITH PROPOFOL N/A 04/04/2018   Procedure: ESOPHAGOGASTRODUODENOSCOPY (EGD) WITH PROPOFOL;  Surgeon: Lollie Sails, MD;  Location: [facility] ENDOSCOPY;  Service: Endoscopy;  Laterality: N/A;  . KNEE ARTHROSCOPY Left   . PROSTATE SURGERY    . TONSILLECTOMY    . TRANSURETHRAL RESECTION OF BLADDER TUMOR N/A 04/30/2018   Procedure: TRANSURETHRAL RESECTION OF BLADDER TUMOR (TURBT);  Surgeon: Abbie Sons, MD;  Location:  [facility] ORS;  Service: Urology;  Laterality: N/A;  . TURP VAPORIZATION  05/10/2011    Home Medications:  Allergies as of 04/28/2020   No Known Allergies     Medication List       Accurate as of April 28, 2020  9:52 AM. If you have any questions, ask your nurse or doctor.        atorvastatin 20 MG tablet Commonly known as: LIPITOR Take 20 mg by mouth every evening.   calcium carbonate 500 MG chewable tablet Commonly known as: TUMS - dosed in mg elemental calcium Chew 1 tablet by mouth daily as needed for indigestion or  heartburn.   Eliquis 5 MG Tabs tablet Generic drug: apixaban Take 5 mg by mouth 2 (two) times daily.   pantoprazole 40 MG tablet Commonly known as: PROTONIX Take 40 mg by mouth 2 (two) times daily.   sertraline 100 MG tablet Commonly known as: ZOLOFT Take 100 mg by mouth every evening.   sildenafil 100 MG tablet Commonly known as: VIAGRA Take 100 mg by mouth daily as needed for erectile dysfunction.   Vitamin D 50 MCG (2000 UT) tablet Take 4,000 Units by mouth at bedtime.       Allergies: No Known Allergies  Family History: Family History  Problem Relation Age of Onset  . Coronary artery disease Mother   . Alcohol abuse Father   . Prostate cancer Neg Hx   . Bladder Cancer Neg Hx   . Kidney cancer Neg Hx     Social History:  reports that he quit smoking about 48 years ago. His smoking use included cigarettes. He smoked 1.00 pack per day. He has never used smokeless tobacco. He reports that he does not drink alcohol and does not use drugs.   Physical Exam: BP 119/76   Pulse 67   Ht 5\' 9"  (1.753 m)   Wt 165 lb (74.8 kg)   BMI 24.37 kg/m   Constitutional:  Alert and oriented, No acute distress. HEENT: [place] AT, moist mucus membranes.  Trachea midline, no masses. Cardiovascular: No clubbing, cyanosis, or edema. Respiratory: Normal respiratory effort, no increased work of breathing. Skin: No rashes, bruises or suspicious lesions. Neurologic: Grossly intact, no focal deficits, moving all 4 extremities. Psychiatric: Normal mood and affect.   Assessment & Plan:    1. Gross hematuria  AUA hematuria risk stratification: High  UA today 3-10 RBCs  We discussed the recommended evaluation for high risk hematuria of CT urogram and cystoscopy.  The procedures were again reviewed in detail and he desires to schedule.   Abbie Sons, [place] [address] [place], [place] [address] [phone]

## 2020-04-29 LAB — URINALYSIS, COMPLETE
Bilirubin, UA: NEGATIVE
Glucose, UA: NEGATIVE
Ketones, UA: NEGATIVE
Leukocytes,UA: NEGATIVE
Nitrite, UA: NEGATIVE
Protein,UA: NEGATIVE
Specific Gravity, UA: 1.015 (ref 1.005–1.030)
Urobilinogen, Ur: 0.2 mg/dL (ref 0.2–1.0)
pH, UA: 5.5 (ref 5.0–7.5)

## 2020-04-29 LAB — MICROSCOPIC EXAMINATION: Bacteria, UA: NONE SEEN

## 2020-04-30 ENCOUNTER — Other Ambulatory Visit: Payer: Self-pay | Admitting: Physical Medicine & Rehabilitation

## 2020-04-30 DIAGNOSIS — G8929 Other chronic pain: Secondary | ICD-10-CM

## 2020-05-02 ENCOUNTER — Encounter: Payer: Self-pay | Admitting: Urology

## 2020-05-12 ENCOUNTER — Other Ambulatory Visit: Payer: Self-pay

## 2020-05-12 ENCOUNTER — Ambulatory Visit
Admission: RE | Admit: 2020-05-12 | Discharge: 2020-05-12 | Disposition: A | Discharge status: Home / Self Care | Payer: Medicare Other | Source: Ambulatory Visit | Attending: Urology | Admitting: Urology

## 2020-05-12 DIAGNOSIS — R310 Gross hematuria: Secondary | ICD-10-CM | POA: Insufficient documentation

## 2020-05-12 LAB — POCT I-STAT CREATININE: Creatinine, Ser: 1.7 mg/dL — ABNORMAL HIGH (ref 0.61–1.24)

## 2020-05-12 MED ORDER — IOHEXOL 300 MG/ML  SOLN
125.0000 mL | Freq: Once | INTRAMUSCULAR | Status: AC | PRN
  Administered 2020-05-12: 80 mL via INTRAVENOUS

## 2020-05-18 ENCOUNTER — Ambulatory Visit: Payer: Medicare Other

## 2020-05-19 ENCOUNTER — Ambulatory Visit
Admission: RE | Admit: 2020-05-19 | Discharge: 2020-05-19 | Disposition: A | Discharge status: Home / Self Care | Payer: Medicare Other | Source: Ambulatory Visit | Attending: Physical Medicine & Rehabilitation | Admitting: Physical Medicine & Rehabilitation

## 2020-05-19 ENCOUNTER — Ambulatory Visit: Payer: Medicare Other

## 2020-05-19 ENCOUNTER — Other Ambulatory Visit: Payer: Self-pay

## 2020-05-19 DIAGNOSIS — M5441 Lumbago with sciatica, right side: Secondary | ICD-10-CM

## 2020-05-19 DIAGNOSIS — G8929 Other chronic pain: Secondary | ICD-10-CM | POA: Diagnosis not present

## 2020-05-19 LAB — CBC
HCT: 43.3 % (ref 39.0–52.0)
Hemoglobin: 15.5 g/dL (ref 13.0–17.0)
MCH: 33 pg (ref 26.0–34.0)
MCHC: 35.8 g/dL (ref 30.0–36.0)
MCV: 92.3 fL (ref 80.0–100.0)
Platelets: 196 10*3/uL (ref 150–400)
RBC: 4.69 MIL/uL (ref 4.22–5.81)
RDW: 12.4 % (ref 11.5–15.5)
WBC: 8.4 10*3/uL (ref 4.0–10.5)
nRBC: 0 % (ref 0.0–0.2)

## 2020-05-19 LAB — APTT: aPTT: 30 seconds (ref 24–36)

## 2020-05-19 LAB — PROTIME-INR
INR: 1 (ref 0.8–1.2)
Prothrombin Time: 13 seconds (ref 11.4–15.2)

## 2020-05-19 MED ORDER — ACETAMINOPHEN 500 MG PO TABS: 1000.0000 mg | ORAL_TABLET | Freq: Four times a day (QID) | ORAL | Status: DC | PRN

## 2020-05-19 MED ORDER — IOHEXOL 180 MG/ML  SOLN
20.0000 mL | Freq: Once | INTRAMUSCULAR | Status: AC | PRN
  Administered 2020-05-19: 20 mL via INTRATHECAL

## 2020-05-19 NOTE — Progress Notes (Signed)
To Radiology via bed

## 2020-05-19 NOTE — Progress Notes (Signed)
Returned from CT.

## 2020-06-02 ENCOUNTER — Other Ambulatory Visit: Payer: Self-pay | Admitting: Urology

## 2020-06-06 NOTE — H&P (View-Only) (Signed)
06/07/2020  CC:  Chief Complaint  Patient presents with  . Cysto    Urologic history: 1.History gross hematuria -Intermittent total gross painless hematuria June 2019 after starting Eliquis -CTU unable to be performed secondary to chronic kidney disease -Cystoscopy with bullous edema anterior/lateral bladder -Cystoscopy with bilateral retrograde pyelogram/biopsies 04/2018 -Normal upper tracts on retrogrades; bladder mucosal changes had resolved; inflammatory tissue and prostatic urethra biopsy which returned edematous, inflamed, reactive urothelium with focal dystrophic calcification  Indications: Gross hematuria-see office note 04/28/2020  HPI: Blake Jenkins is a 79 y.o. male who presents today for a cystoscopy.    Denies recurrent hematuria  CT hematuria work up on 05/12/20 noted there are up to 10 clustered small filling defects layering in the dependent (anterior on the prone delayed images) urinary bladder which are relatively occult on the other contrast phases, but which could represent small otherwise non conspicuous calculi, blood clots, or less likely fungus balls.     Blood pressure 112/72, pulse 61, height 5\' 9"  (1.753 m), weight 160 lb (72.6 kg). NED. A&Ox3.   No respiratory distress   Abd soft, NT, ND Normal phallus with bilateral descended testicles  Cystoscopy Procedure Note  Patient identification was confirmed, informed consent was obtained, and patient was prepped using Betadine solution.  Lidocaine jelly was administered per urethral meatus.     Pre-Procedure: - Inspection reveals a normal caliber urethral meatus.  Procedure: The flexible cystoscope was introduced without difficulty - No urethral strictures/lesions are present. - Prostate BPH with TUR defect - Normal bladder neck - Bilateral ureteral orifices identified - Bladder mucosa  reveals solid appearing nodular mass in the left inferior base. - Marked bullous edema at the posterior wall. -  No bladder stones - No trabeculation  Retroflexion shows no abnormalities.    Post-Procedure: - Patient tolerated the procedure well  Pertinent image:   CLINICAL DATA:  Intermittent hematuria, including 3 weeks ago.  EXAM: CT ABDOMEN AND PELVIS WITHOUT AND WITH CONTRAST  TECHNIQUE: Multidetector CT imaging of the abdomen and pelvis was performed following the standard protocol before and following the bolus administration of intravenous contrast.  CONTRAST:  9mL OMNIPAQUE IOHEXOL 300 MG/ML  SOLN  COMPARISON:  Renal ultrasound of 04/19/2018  FINDINGS: Lower chest: Reticulonodular opacity in the left lower lobe with some speckled calcifications likely due to chronic atypical infection. Mild scarring in the left lower lobe. Prominent epicardial adipose tissue. 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  Left anterior descending coronary artery atherosclerosis.  Hepatobiliary: Unremarkable  Pancreas: Unremarkable  Spleen: Unremarkable  Adrenals/Urinary Tract: 2 mm left kidney lower pole nonobstructive renal calculus on image 63/5. 1.6 by 1.3 cm hypodense lesion of the right mid kidney posteriorly on image 32/9, no enhancement, compatible with benign cyst, Bosniak category 1. Small left peripelvic cysts.  On the delayed images, there are about 10 clustered filling defects layering in the dependent (anterior) urinary bladder for example on image 114/20 which are relatively occult on the other contrast phases, but which could represent tiny calculi, blood clots, or less likely fungus balls. I am skeptical that these represent tumor given that there are clustered dependently and do not really seem attached to the wall of the bladder.  Both adrenal glands appear normal.  Stomach/Bowel: Partial gastric pull-through as noted above.  Normal appendix.  Vascular/Lymphatic: Aortoiliac atherosclerotic vascular disease. No pathologic adenopathy identified.  Reproductive: The prostate gland measures 4.8 by 6.1 by  6.5 cm (volume = 100 cm^3), compatible with prostatomegaly. The prostate indents the bladder base.  Other: No supplemental non-categorized findings.  Musculoskeletal: Degenerative hip arthropathy bilaterally with considerable associated spurring. Lumbar and thoracic spondylosis with lower lumbar degenerative disc disease. Bilateral foraminal impingement at L5-S1 primarily from spurring. Small direct left inguinal hernia containing adipose tissue.  IMPRESSION: 1. There are up to 10 clustered small filling defects layering in the dependent (anterior on the prone delayed images) urinary bladder which are relatively occult on the other contrast phases, but which could represent small otherwise non conspicuous calculi, blood clots, or less likely fungus balls. I am skeptical that these represent tumor given that there are clustered dependently and do not seen really attached to the wall of the bladder. Cystoscopy is recommended. 2. 2 mm left kidney lower pole nonobstructive renal calculus. 3. Other imaging findings of potential clinical significance: Coronary atherosclerosis. 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 Lumbar and thoracic spondylosis and degenerative disc disease causing bilateral foraminal impingement at L5-S1. Small direct left inguinal hernia containing adipose tissue. 4. Aortic atherosclerosis.  Aortic Atherosclerosis (ICD10-I70.0).   Electronically Signed   By: Van Clines M.D.   On: 05/12/2020 15:57  I have personally reviewed the images and agree with radiologist interpretation.    Assessment/ Plan:  1. Bladder mass with bullous edema  I discussed findings of cystoscopy and recommended a  cystoscopy under anesthesia and TURBT.  The indications and nature of the planned procedure were discussed as well as the potential benefits and expected outcome.  Alternatives have been discussed.  Potential complications were discussed including but not limited to bleeding, infection and bladder perforation. The postoperative care and followup was reviewed. All of his questions were answered and he desires to proceed.  Patient is on Eliquis and will need cardiac clearance.    Fransico Him, am acting as a scribe for Dr. Nicki Reaper C. Broderic Bara,   I have reviewed the above documentation for accuracy and completeness, and I agree with the above.   Abbie Sons, MD

## 2020-06-06 NOTE — Progress Notes (Signed)
06/07/2020  CC:  Chief Complaint  Patient presents with  . Cysto    Urologic history: 1.History gross hematuria -Intermittent total gross painless hematuria June 2019 after starting Eliquis -CTU unable to be performed secondary to chronic kidney disease -Cystoscopy with bullous edema anterior/lateral bladder -Cystoscopy with bilateral retrograde pyelogram/biopsies 04/2018 -Normal upper tracts on retrogrades; bladder mucosal changes had resolved; inflammatory tissue and prostatic urethra biopsy which returned edematous, inflamed, reactive urothelium with focal dystrophic calcification  Indications: Gross hematuria-see office note 04/28/2020  HPI: Blake Jenkins is a 80 y.o. male who presents today for a cystoscopy.    Denies recurrent hematuria  CT hematuria work up on 05/12/20 noted there are up to 10 clustered small filling defects layering in the dependent (anterior on the prone delayed images) urinary bladder which are relatively occult on the other contrast phases, but which could represent small otherwise non conspicuous calculi, blood clots, or less likely fungus balls.     Blood pressure 112/72, pulse 61, height 5\' 9"  (1.753 m), weight 160 lb (72.6 kg). NED. A&Ox3.   No respiratory distress   Abd soft, NT, ND Normal phallus with bilateral descended testicles  Cystoscopy Procedure Note  Patient identification was confirmed, informed consent was obtained, and patient was prepped using Betadine solution.  Lidocaine jelly was administered per urethral meatus.     Pre-Procedure: - Inspection reveals a normal caliber urethral meatus.  Procedure: The flexible cystoscope was introduced without difficulty - No urethral strictures/lesions are present. - Prostate BPH with TUR defect - Normal bladder neck - Bilateral ureteral orifices identified - Bladder mucosa  reveals solid appearing nodular mass in the left inferior base. - Marked bullous edema at the posterior wall. -  No bladder stones - No trabeculation  Retroflexion shows no abnormalities.    Post-Procedure: - Patient tolerated the procedure well  Pertinent image:   CLINICAL DATA:  Intermittent hematuria, including 3 weeks ago.  EXAM: CT ABDOMEN AND PELVIS WITHOUT AND WITH CONTRAST  TECHNIQUE: Multidetector CT imaging of the abdomen and pelvis was performed following the standard protocol before and following the bolus administration of intravenous contrast.  CONTRAST:  26mL OMNIPAQUE IOHEXOL 300 MG/ML  SOLN  COMPARISON:  Renal ultrasound of 04/19/2018  FINDINGS: Lower chest: Reticulonodular opacity in the left lower lobe with some speckled calcifications likely due to chronic atypical infection. Mild scarring in the left lower lobe. Prominent epicardial adipose tissue. 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  Left anterior descending coronary artery atherosclerosis.  Hepatobiliary: Unremarkable  Pancreas: Unremarkable  Spleen: Unremarkable  Adrenals/Urinary Tract: 2 mm left kidney lower pole nonobstructive renal calculus on image 63/5. 1.6 by 1.3 cm hypodense lesion of the right mid kidney posteriorly on image 32/9, no enhancement, compatible with benign cyst, Bosniak category 1. Small left peripelvic cysts.  On the delayed images, there are about 10 clustered filling defects layering in the dependent (anterior) urinary bladder for example on image 114/20 which are relatively occult on the other contrast phases, but which could represent tiny calculi, blood clots, or less likely fungus balls. I am skeptical that these represent tumor given that there are clustered dependently and do not really seem attached to the wall of the bladder.  Both adrenal glands appear normal.  Stomach/Bowel: Partial gastric pull-through as noted above.  Normal appendix.  Vascular/Lymphatic: Aortoiliac atherosclerotic vascular disease. No pathologic adenopathy identified.  Reproductive: The prostate gland measures 4.8 by 6.1 by  6.5 cm (volume = 100 cm^3), compatible with prostatomegaly. The prostate indents the bladder base.  Other: No supplemental non-categorized findings.  Musculoskeletal: Degenerative hip arthropathy bilaterally with considerable associated spurring. Lumbar and thoracic spondylosis with lower lumbar degenerative disc disease. Bilateral foraminal impingement at L5-S1 primarily from spurring. Small direct left inguinal hernia containing adipose tissue.  IMPRESSION: 1. There are up to 10 clustered small filling defects layering in the dependent (anterior on the prone delayed images) urinary bladder which are relatively occult on the other contrast phases, but which could represent small otherwise non conspicuous calculi, blood clots, or less likely fungus balls. I am skeptical that these represent tumor given that there are clustered dependently and do not seen really attached to the wall of the bladder. Cystoscopy is recommended. 2. 2 mm left kidney lower pole nonobstructive renal calculus. 3. Other imaging findings of potential clinical significance: Coronary atherosclerosis. 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 Lumbar and thoracic spondylosis and degenerative disc disease causing bilateral foraminal impingement at L5-S1. Small direct left inguinal hernia containing adipose tissue. 4. Aortic atherosclerosis.  Aortic Atherosclerosis (ICD10-I70.0).   Electronically Signed   By: Van Clines M.D.   On: 05/12/2020 15:57  I have personally reviewed the images and agree with radiologist interpretation.    Assessment/ Plan:  1. Bladder mass with bullous edema  I discussed findings of cystoscopy and recommended a  cystoscopy under anesthesia and TURBT.  The indications and nature of the planned procedure were discussed as well as the potential benefits and expected outcome.  Alternatives have been discussed.  Potential complications were discussed including but not limited to bleeding, infection and bladder perforation. The postoperative care and followup was reviewed. All of his questions were answered and he desires to proceed.  Patient is on Eliquis and will need cardiac clearance.    Fransico Him, am acting as a scribe for Dr. Nicki Reaper C. Tarrin Lebow,   I have reviewed the above documentation for accuracy and completeness, and I agree with the above.   Abbie Sons, MD

## 2020-06-07 ENCOUNTER — Ambulatory Visit (INDEPENDENT_AMBULATORY_CARE_PROVIDER_SITE_OTHER): Payer: Medicare Other | Admitting: Urology

## 2020-06-07 ENCOUNTER — Other Ambulatory Visit: Payer: Self-pay | Admitting: Radiology

## 2020-06-07 ENCOUNTER — Encounter: Payer: Self-pay | Admitting: Urology

## 2020-06-07 ENCOUNTER — Other Ambulatory Visit: Payer: Self-pay

## 2020-06-07 VITALS — BP 112/72 | HR 61 | Ht 69.0 in | Wt 160.0 lb

## 2020-06-07 DIAGNOSIS — N3289 Other specified disorders of bladder: Secondary | ICD-10-CM

## 2020-06-07 DIAGNOSIS — R310 Gross hematuria: Secondary | ICD-10-CM

## 2020-06-08 LAB — MICROSCOPIC EXAMINATION: Bacteria, UA: NONE SEEN

## 2020-06-08 LAB — URINALYSIS, COMPLETE
Bilirubin, UA: NEGATIVE
Ketones, UA: NEGATIVE
Leukocytes,UA: NEGATIVE
Nitrite, UA: NEGATIVE
Protein,UA: NEGATIVE
Specific Gravity, UA: 1.015 (ref 1.005–1.030)
Urobilinogen, Ur: 1 mg/dL (ref 0.2–1.0)
pH, UA: 5.5 (ref 5.0–7.5)

## 2020-06-09 ENCOUNTER — Other Ambulatory Visit: Payer: Self-pay | Admitting: Urology

## 2020-06-14 ENCOUNTER — Other Ambulatory Visit: Payer: Self-pay

## 2020-06-14 ENCOUNTER — Other Ambulatory Visit: Payer: Medicare Other

## 2020-06-14 DIAGNOSIS — N3289 Other specified disorders of bladder: Secondary | ICD-10-CM

## 2020-06-14 DIAGNOSIS — R310 Gross hematuria: Secondary | ICD-10-CM

## 2020-06-15 ENCOUNTER — Encounter
Admission: RE | Admit: 2020-06-15 | Discharge: 2020-06-15 | Disposition: A | Discharge status: Home / Self Care | Payer: Medicare Other | Source: Ambulatory Visit | Attending: Urology | Admitting: Urology

## 2020-06-15 LAB — URINALYSIS, COMPLETE
Bilirubin, UA: NEGATIVE
Leukocytes,UA: NEGATIVE
Nitrite, UA: NEGATIVE
Specific Gravity, UA: 1.025 (ref 1.005–1.030)
Urobilinogen, Ur: 1 mg/dL (ref 0.2–1.0)
pH, UA: 5.5 (ref 5.0–7.5)

## 2020-06-15 LAB — MICROSCOPIC EXAMINATION

## 2020-06-15 NOTE — Patient Instructions (Signed)
Your procedure is scheduled on: 06-22-20 TUESDAY Report to Day Surgery on the 2nd floor of the [facility]. To find out your arrival time, please call [phone] between 1PM - 3PM on: 06-21-20 MONDAY  REMEMBER: Instructions that are not followed completely may result in serious medical risk, up to and including death; or upon the discretion of your surgeon and anesthesiologist your surgery may need to be rescheduled.  Do not eat food after midnight the night before surgery.  No gum chewing, lozengers or hard candies.  You may however, drink CLEAR liquids up to 2 hours before you are scheduled to arrive for your surgery. Do not drink anything within 2 hours of your scheduled arrival time.  Clear liquids include: - water  - apple juice without pulp - gatorade (not RED) - black coffee or tea (Do NOT add milk or creamers to the coffee or tea) Do NOT drink anything that is not on this list.   TAKE THESE MEDICATIONS THE MORNING OF SURGERY WITH A SIP OF WATER:   -METOPROLOL (TOPROL) -PROTONIX (PANTOPRAZOLE)  Follow recommendations from Cardiologist, Pulmonologist or PCP regarding stopping Aspirin, Coumadin, Plavix, Eliquis, Pradaxa, or Pletal-LAST DOSE OF ELIQUIS ON 06-18-20 FRIDAY AS INSTRUCTED BY OFFICE   One week prior to surgery: Stop Anti-inflammatories (NSAIDS) such as Advil, Aleve, Ibuprofen, Motrin, Naproxen, Naprosyn and Aspirin based products such as Excedrin, Goodys Powder, BC Powder-TYLENOL OK TO TAKE  Stop ANY OVER THE COUNTER supplements until after surgery. (You may continue taking Tylenol, Vitamin D, Vitamin B, and multivitamin.)  No Alcohol for 24 hours before or after surgery.  No Smoking including e-cigarettes for 24 hours prior to surgery.  No chewable tobacco products for at least 6 hours prior to surgery.  No nicotine patches on the day of surgery.  Do not use any "recreational" drugs for at least a week prior to your surgery.  Please be advised that the  combination of cocaine and anesthesia may have negative outcomes, up to and including death. If you test positive for cocaine, your surgery will be cancelled.  On the morning of surgery brush your teeth with toothpaste and water, you may rinse your mouth with mouthwash if you wish. Do not swallow any toothpaste or mouthwash.  Do not wear jewelry, make-up, hairpins, clips or nail polish.  Do not wear lotions, powders, or perfumes.   Do not shave 48 hours prior to surgery.   Contact lenses, hearing aids and dentures may not be worn into surgery.  Do not bring valuables to the hospital. [facility] is not responsible for any missing/lost belongings or valuables.   Bring your C-PAP to the hospital with you in case you may have to spend the night.   Notify your doctor if there is any change in your medical condition (cold, fever, infection).  Wear comfortable clothing (specific to your surgery type) to the hospital.  Plan for stool softeners for home use; pain medications have a tendency to cause constipation. You can also help prevent constipation by eating foods high in fiber such as fruits and vegetables and drinking plenty of fluids as your diet allows.  After surgery, you can help prevent lung complications by doing breathing exercises.  Take deep breaths and cough every 1-2 hours. Your doctor may order a device called an Incentive Spirometer to help you take deep breaths. When coughing or sneezing, hold a pillow firmly against your incision with both hands. This is called "splinting." Doing this helps protect your incision. It  also decreases belly discomfort.  If you are being admitted to the hospital overnight, leave your suitcase in the car. After surgery it may be brought to your room.  If you are being discharged the day of surgery, you will not be allowed to drive home. You will need a responsible adult (18 years or older) to drive you home and stay with you that night.   If  you are taking public transportation, you will need to have a responsible adult (18 years or older) with you. Please confirm with your physician that it is acceptable to use public transportation.   Please call the [facility] at [phone] if you have any questions about these instructions.  Visitation Policy:  Patients undergoing a surgery or procedure may have one family member or support person with them as long as that person is not COVID-19 positive or experiencing its symptoms.  That person may remain in the waiting area during the procedure.  Inpatient Visitation Update:   In an effort to ensure the safety of our team members and our patients, we are implementing a change to our visitation policy:  Effective Monday, Aug. 9, at 7 a.m., inpatients will be allowed one support person.  o The support person may change daily.  o The support person must pass our screening, gel in and out, and wear a mask at all times, including in the patient's room.  o Patients must also wear a mask when staff or their support person are in the room.  o Masking is required regardless of vaccination status.  Systemwide, no visitors 17 or younger.

## 2020-06-15 NOTE — Pre-Procedure Instructions (Signed)
ECG 12-lead  Component 1 mo ago  Vent Rate (bpm)  67     PR Interval (msec)  150     QRS Interval (msec)  76     QT Interval (msec)  390     QTc (msec)  412     Resulting Agency [facility] RESULTS  Narrative Performed by [place] This result has an attachment that is not available.  Normal sinus rhythm  Nonspecific T wave abnormalities   When compared with ECG of 16-May-2019 10:26,  No significant change was found  I reviewed and concur with this report. Electronically signed LU:DAPT MD, KEN [phone]) on 05/14/2020 11:31:25 AM Specimen Collected: 05/07/20 10:33 AM Last Resulted: 05/07/20 10:33 AM  Received From: [facility]  Result Received: 06/04/20 9:01 AM

## 2020-06-15 NOTE — Pre-Procedure Instructions (Signed)
Progress Notes - documented in this encounter Zeb Comfort, [place] - 06/08/2020 3:30 PM EDT Formatting of this note is different from the original. Images from the original note were not included. Established Patient Visit   Chief Complaint: Chief Complaint  Patient presents with   Follow-up  holter  Date of Service: 06/08/2020 Date of Birth: 04-May-1941 PCP: Gladstone Lighter, MD  History of Present Illness: Mr. Adriano is a 79 y.o.male patient patient with a past medical history significant for paroxsymal atrial fibrillation, anticoagulated with Eliquis, hypercholesterolemia, hypertension, and chronic kidney disease who presents to review Holter monitor results He admits to a several month history of intermittent hematuria, dating back to 2019, in which he is currently undergoing workup with urology. Cystoscopy under anesthesia and TURBT have been recommended and surgery is scheduled for 06/22/20.   Mr. Claunch reports occasional heart racing/palpitations with associated shortness of breath, but otherwise he is doing well. He was recently started on metoprolol for wide-complex tachycardia detected on Holter monitor and he has been tolerating it well. He remains on Eliquis 5mg  BID with no recent episodes of hematuria. He denies chest pain, lower extremity swelling, orthopnea, PND, or syncopal/presyncopal episodes.   We discussed the results of the Holter monitor at today's visit which revealed episodes of atrial fibrillation with RVR, a burden of 5% with frequent PACs and occasional PVCs. There was one run of wide-complex tachycardia lasting 26 beats long. Patient events were associated with afib with RVR. Echocardiogram on 02/22/18 revealed normal RV and LV systolic function with an EF estimated greater than 55% with mild MR and mild TR.   Past Medical and Surgical History  Past Medical History Past Medical History:  Diagnosis Date   A-fib (CMS-HCC)  Transient   Acid reflux   Arthritis    Atrial fibrillation, new onset (CMS-HCC) 02/14/2018   Barretts esophagus  carcinoma-in-situ, Dr. Felton Clinton in [place]   Colon polyp   Depression   Hypertension   Mixed hyperlipidemia 03/19/2019   Osteoarthritis of both knees   Stroke (CMS-HCC) 03/2011   Past Surgical History He has a past surgical history that includes Prostate surgery; esophageal resection; Knee arthroscopy (Left); Adenoidectomy; Tonsillectomy; TURP vaporization (05/10/11); Colonoscopy; Colonoscopy (01/30/2017); Colonoscopy (12/20/2017); egd (03/04/2018); and egd (04/04/2018).   Medications and Allergies  Current Medications  Current Outpatient Medications  Medication Sig Dispense Refill   apixaban (ELIQUIS) 5 mg tablet Take 1 tablet every 12 hours to prevent stroke with atrial fibrillation 180 tablet 3   atorvastatin (LIPITOR) 20 MG tablet Take 1 tablet (20 mg total) by mouth once daily 90 tablet 3   calcium carbonate 500 mg calcium (1,250 mg) chewable tablet Take 500 mg of elemental by mouth once daily   cholecalciferol (VITAMIN D3) 2,000 unit capsule Take 2,000 Units by mouth 2 (two) times daily.   lisinopriL (ZESTRIL) 5 MG tablet Take 1 tablet (5 mg total) by mouth once daily 90 tablet 3   metoprolol succinate (TOPROL-XL) 25 MG XL tablet Take 1 tablet (25 mg total) by mouth once daily 30 tablet 1   pantoprazole (PROTONIX) 40 MG DR tablet Take 1 tablet (40 mg total) by mouth 2 (two) times daily 180 tablet 3   sertraline (ZOLOFT) 100 MG tablet Take 1 tablet (100 mg total) by mouth once daily 90 tablet 3   sucralfate (CARAFATE) 1 gram tablet Take 1 tablet (1 g total) by mouth 3 (three) times daily 90 tablet 11   No current facility-administered medications for this visit.  Allergies: Patient has no known allergies.  Social and Family History  Social History reports that he quit smoking about 48 years ago. He smoked 1.00 pack per day. He has never used smokeless tobacco. He reports that he does not drink  alcohol and does not use drugs.  Family History Family History  Problem Relation Age of Onset   Coronary Artery Disease (Blocked arteries around heart) Mother   Alcohol abuse Father   Cancer Neg Hx   Review of Systems   Review of Systems: The patient denies chest pain, shortness of breath, orthopnea, paroxysmal nocturnal dyspnea, pedal edema, palpitations, heart racing, fatigue, dizziness, lightheadedness, presyncope, syncope, leg pain, leg cramping. Review of 12 Systems is negative except as described in HPI.   Physical Examination   Vitals:BP 130/82   Pulse 59   Resp 16   Ht 175.3 cm (5\' 9" )   Wt 74.4 kg (164 lb)   SpO2 98%   BMI 24.22 kg/m  Ht:175.3 cm (5\' 9" ) Wt:74.4 kg (164 lb) ZDG:LOVF surface area is 1.9 meters squared. Body mass index is 24.22 kg/m.  General: Well developed, well nourished. In no acute distress HEENT: Pupils equally reactive to light and accomodation  Neck: Supple without thyromegaly, or goiter. Carotid pulses 2+. No carotid bruits present.  Pulmonary: Clear to auscultation bilaterally; no wheezes, rales, rhonchi Cardiovascular: Bradycardic, regular rhythm. No gallops, murmurs or rubs Gastrointestinal: Soft nontender, nondistended, with normal bowel sounds Extremities: No cyanosis, clubbing, or edema Peripheral Pulses: 2+ in upper extremities, 2+ in lower extremities  Neurology: Alert and oriented X3 Pysch: Good affect. Responds appropriately  Assessment and Plan   79 y.o. male with  1. Paroxysmal atrial fibrillation (CMS-HCC)  -5% burden on recent Holter with episodes of RVR -Will continue metoprolol 25mg  daily for rate control  -Continue Eliquis 5mg  BID  2. Essential hypertension  -Continue lisinopril 5mg  daily and metoprolol 25mg  daily  3. Stage 3a chronic kidney disease (CMS-HCC)  -Continue routine f/u with PCP  4. Hypercholesterolemia, variable, untreated LDL ~150  -Continue atorvastatin 20mg  daily  5. Mixed hyperlipidemia  -See above   6. Preop cardiovascular exam  -With evidence of wide-complex tachycardia on Holter, will obtain echocardiogram prior to cardiac clearance - echo scheduled for 9/28  -If LV function is normal, no further workup needed and okay to hold Eliquis 2 days prior  7. Wide-complex tachycardia (CMS-HCC)  -Metoprolol succinate 25mg  recently started; will continue with this  -Echocardiogram scheduled for 9/28 to assess LV function     Orders Placed This Encounter  Procedures   Echo complete   Return in about 3 months (around 09/07/2020).  I personally performed the service, non-incident to. ([place])   NICOLE Julian Hy, PA    Electronically signed by Zeb Comfort, PA at 06/09/2020 11:27 AM EDT

## 2020-06-16 LAB — CULTURE, URINE COMPREHENSIVE

## 2020-06-16 NOTE — Progress Notes (Signed)
[facility] Perioperative Services  Pre-Admission/Anesthesia Testing Clinical Review  Date: 06/16/20  Patient Demographics:  Name: Blake Jenkins DOB:   [birth year] MRN:   [id number]  Planned Surgical Procedure(s):    Case: [id number] Date/Time: 06/22/20 1431   Procedure: TRANSURETHRAL RESECTION OF BLADDER TUMOR (TURBT) (N/A )   Anesthesia type: Choice   Pre-op diagnosis: hematuria, bladder mass   Location: [facility] OR ROOM 05 / [place] ORS FOR ANESTHESIA GROUP   Surgeons: Abbie Sons, MD     NOTE: Available PAT nursing documentation and vital signs have been reviewed. Clinical nursing staff has updated patient's PMH/PSHx, current medication list, and drug allergies/intolerances to ensure comprehensive history available to assist in medical decision making as it pertains to the aforementioned surgical procedure and anticipated anesthetic course.   Clinical Discussion:  Blake Jenkins is a 79 y.o. male who is submitted for pre-surgical anesthesia review and clearance prior to him undergoing the above procedure.Patient is a Former Smoker (2 pack years; quit 03/1972). Pertinent PMH includes: PAF, HTN, HLD, GERD (on daily PPI), Barrett's esophagus (s/p resection of dysplastic portion in 2011), ocular stroke (2010),  CKD-III, OA, BPH (s/p TURP), chronic lumbar back pain, depression.  Patient is followed by cardiology Ubaldo Glassing, MD). He was last seen in the cardiology clinic on 06/08/2020; notes reviewed. Patient experiencing intermittent palpitations and associated shortness of breath. No chest pain, PND, orthopnea,peripheral edema, and presyncope/syncope. Recently started on beta blocker (metoprolol) for wide-complex tachycardia noted on event monitor study; doing well. HTN well controlled on prescribed beta blocker and ACEi therapy. Patient is on a statin for his HLD. TTE in 02/2018 revealed normal RV and LV systolic function; LVEF > 55% with mild valvular insufficiency. Patient presented  for review of recent event monitor study and pre-surgical cardiac clearance. Given the wide-complex tachycardia noted on event monitor study, the decision was made to repeat patient's TTE prior to issuing cardiac clearance for ambulatory surgical procedure.   Repeat TTE was done on 06/15/2020 remains grossly unchanged from previous (see full results below). Given normal study, clearance to proceed with planned urological procedure was issued by cardiology with LOW risk stratification. This patient is on daily anticoagulation therapy. He has been instructed on recommendations for holding his apixaban for 3 days prior to his procedure. The patient has been instructed that his last dose of his anticoagulant will be on 06/18/2020.  He denies previous perioperative complications with anesthesia. He underwent a general anesthetic course here (ASA III) in 04/2018 with no documented complications. Patient with history of lower back pain. Results from most recent imaging of lumbar spine included below for review by the anesthesia team in the event that neuraxial anesthetic course is considered as part of this patient's plan of care.  Vitals with BMI 06/07/2020 05/19/2020 05/19/2020  Height 5\' 9"  - -  Weight 160 lbs - -  BMI 29.56 - -  Systolic 213 086 578  Diastolic 72 91 87  Pulse 61 53 56    Providers/Specialists:   NOTE: Primary physician provider listed below. Patient may have been seen by APP or partner within same practice.   PROVIDER ROLE LAST Claud Kelp, MD Urology (Surgeon) 06/07/2020  Gladstone Lighter, MD Primary Care Provider 05/27/2020  Bartholome Bill, MD Cardiology 06/08/2020   Allergies:  Patient has no known allergies.  Current Home Medications:   No current facility-administered medications for this encounter.   Marland Kitchen acetaminophen (TYLENOL) 500 MG tablet  . apixaban (ELIQUIS) 5 MG  TABS tablet  . atorvastatin (LIPITOR) 20 MG tablet  . calcium carbonate (TUMS - DOSED IN MG  ELEMENTAL CALCIUM) 500 MG chewable tablet  . Cholecalciferol (VITAMIN D) 2000 units tablet  . lisinopril (ZESTRIL) 5 MG tablet  . metoprolol succinate (TOPROL-XL) 25 MG 24 hr tablet  . pantoprazole (PROTONIX) 40 MG tablet  . sertraline (ZOLOFT) 100 MG tablet  . sucralfate (CARAFATE) 1 g tablet  . sildenafil (VIAGRA) 100 MG tablet   History:   Past Medical History:  Diagnosis Date  . A-fib ([place])   . Arthritis    RIGHT KNEE  . Barrett's esophagus with dysplasia    resection 2011  . BPH (benign prostatic hyperplasia)   . Cancer [facility])    January 2001 Esophageal Cancer in situ  . Chronic kidney disease    stage 3  . Colon polyps   . Depression   . Dysrhythmia    A-FIB,TRANSIENT  . Elevated PSA   . Genetic testing 01/04/2018   Multi-Cancer panel (83 genes) @ [facility] - No pathogenic mutations detected  . GERD (gastroesophageal reflux disease)   . History of esophageal cancer   . Hypercholesteremia   . Hypertension   . Hypertrophy of prostate without urinary obstruction   . Knee pain, bilateral   . Shoulder pain, left   . Stroke [facility]) 2010   optic    Past Surgical History:  Procedure Laterality Date  . ADENOIDECTOMY    . COLONOSCOPY WITH PROPOFOL N/A 01/30/2017   Procedure: COLONOSCOPY WITH PROPOFOL;  Surgeon: Lollie Sails, MD;  Location: [facility] ENDOSCOPY;  Service: Endoscopy;  Laterality: N/A;  . COLONOSCOPY WITH PROPOFOL N/A 12/20/2017   Procedure: COLONOSCOPY WITH PROPOFOL;  Surgeon: Lollie Sails, MD;  Location: [facility] ENDOSCOPY;  Service: Endoscopy;  Laterality: N/A;  . CYSTOSCOPY W/ RETROGRADES Bilateral 04/30/2018   Procedure: CYSTOSCOPY WITH RETROGRADE PYELOGRAM;  Surgeon: Abbie Sons, MD;  Location: [facility] ORS;  Service: Urology;  Laterality: Bilateral;  . ESOPHAGEAL RESECTION    . ESOPHAGOGASTRODUODENOSCOPY (EGD) WITH PROPOFOL N/A 03/04/2018   Procedure: ESOPHAGOGASTRODUODENOSCOPY (EGD) WITH PROPOFOL;  Surgeon: Lollie Sails, MD;  Location: [facility]  ENDOSCOPY;  Service: Endoscopy;  Laterality: N/A;  . ESOPHAGOGASTRODUODENOSCOPY (EGD) WITH PROPOFOL N/A 04/04/2018   Procedure: ESOPHAGOGASTRODUODENOSCOPY (EGD) WITH PROPOFOL;  Surgeon: Lollie Sails, MD;  Location: [facility] ENDOSCOPY;  Service: Endoscopy;  Laterality: N/A;  . KNEE ARTHROSCOPY Left   . PROSTATE SURGERY    . TONSILLECTOMY     age 68  . TRANSURETHRAL RESECTION OF BLADDER TUMOR N/A 04/30/2018   Procedure: TRANSURETHRAL RESECTION OF BLADDER TUMOR (TURBT);  Surgeon: Abbie Sons, MD;  Location: [facility] ORS;  Service: Urology;  Laterality: N/A;  . TURP VAPORIZATION  05/10/2011   Family History  Problem Relation Age of Onset  . Coronary artery disease Mother   . Alcohol abuse Father   . Prostate cancer Neg Hx   . Bladder Cancer Neg Hx   . Kidney cancer Neg Hx      Pertinent Clinical Results:  LABS: Labs reviewed: Acceptable for surgery.       ECG: Date: 05/07/2020 Rate: 67 bpm Rhythm: normal sinus Axis (leads I and aVF): Normal Intervals: PR 150 ms. QRS 76 ms. QTc 412 ms. ST segment and T wave changes: Non-specific T wave abnormalities; no acute ST segment elevation or depression Comparison: Similar to previous tracing obtained on 05/16/2019 NOTE: Tracing obtained at [facility]; unable for review. Above based on cardiologist's interpretation.    IMAGING / PROCEDURES:  ECHOCARDIOGRAM done on 06/15/2020 1. LVEF >55% 2. Normal LV systolic function 3. Normal RV systolic function 4. Mild MR, TR, PR 5. Trivial AR 6. No valvular stenosis  CT LUMBAR SPINE and DIAGNOSTIC MYELOGRAM done on 05/19/2020 1. No acute osseous injury of the lumbar spine 2. Lumbar spine spondylosis  Degenerative disease with disc height loss throughout the lumbar spine most severe at L4-5.  T12-L1: Minimal broad-based disc bulge. Mild bilateral facet arthropathy. No foraminal or central canal stenosis.  L1-L2: Minimal broad-based disc bulge. Mild bilateral facet arthropathy. No  foraminal or central canal stenosis.  L2-L3: Broad-based disc bulge with a left foraminal broad disc protrusion. Left lateral recess narrowing. No foraminal stenosis, Mild spinal stenosis. Moderate bilateral facet arthropathy.  L3-L4: Broad-based disc bulge. Moderate bilateral facet arthropathy. Mild-moderate spinal stenosis. No foraminal stenosis.  L4-L5: Broad-based disc bulge severe right and moderate left facet arthropathy. No foraminal stenosis. Right lateral recess stenosis.  L5-S1: Broad-based disc osteophyte complex. Moderate left and mild right facet arthropathy. Moderate left and mild right foraminal stenosis.  HOLTER MONITOR STUDY done on 05/07/2020 1. Episodes of atrial fibrillation with RVR, a burden of 5% with frequent PACs and occasional PVCs.  2. There was one run of wide-complex tachycardia lasting 26 beats long.  3. Patient events were associated with afib with RVR.  Impression and Plan:  Blake Jenkins has been referred for pre-anesthesia review and clearance prior to him undergoing the planned anesthetic and procedural courses. Available labs, pertinent testing, and imaging results were personally reviewed by me. This patient has been appropriately cleared by cardiology.   Based on clinical review performed today (06/16/20), barring any significant acute changes in the patient's overall condition, it is anticipated that he will be able to proceed with the planned surgical intervention. Any acute changes in clinical condition may necessitate his procedure being postponed and/or cancelled. Pre-surgical instructions were reviewed with the patient during his PAT appointment and questions were fielded by PAT clinical staff.  Honor Loh, MSN, APRN, FNP-C, CEN [facility]  Peri-operative Services Nurse Practitioner Phone: [phone] 06/16/20 2:23 PM  NOTE: This note has been prepared using Dragon dictation software. Despite my best ability to proofread, there  is always the potential that unintentional transcriptional errors may still occur from this process.

## 2020-06-17 ENCOUNTER — Other Ambulatory Visit: Payer: Self-pay | Admitting: *Deleted

## 2020-06-17 ENCOUNTER — Telehealth: Payer: Self-pay | Admitting: *Deleted

## 2020-06-17 MED ORDER — SULFAMETHOXAZOLE-TRIMETHOPRIM 800-160 MG PO TABS: 1.0000 | ORAL_TABLET | Freq: Two times a day (BID) | ORAL | 0 refills | Status: AC

## 2020-06-17 NOTE — Telephone Encounter (Signed)
-----   Message from Nori Riis, PA-C sent at 06/16/2020  4:56 PM EDT ----- Please let Blake Jenkins know that his urine culture returned positive and we need to go ahead and treat as he scheduled for surgery.  I would like for him to start Septra DS, twice daily x14 days

## 2020-06-17 NOTE — Telephone Encounter (Signed)
Notified patient as instructed, patient pleased. Discussed follow-up appointments, patient agrees  

## 2020-06-18 ENCOUNTER — Other Ambulatory Visit
Admission: RE | Admit: 2020-06-18 | Discharge: 2020-06-18 | Disposition: A | Discharge status: Home / Self Care | Payer: Medicare Other | Source: Ambulatory Visit | Attending: Urology | Admitting: Urology

## 2020-06-18 DIAGNOSIS — Z01812 Encounter for preprocedural laboratory examination: Secondary | ICD-10-CM | POA: Insufficient documentation

## 2020-06-18 DIAGNOSIS — Z20822 Contact with and (suspected) exposure to covid-19: Secondary | ICD-10-CM | POA: Insufficient documentation

## 2020-06-19 LAB — SARS CORONAVIRUS 2 (TAT 6-24 HRS): SARS Coronavirus 2: NEGATIVE

## 2020-06-22 ENCOUNTER — Encounter
Admission: RE | Disposition: A | Discharge status: Home / Self Care | Payer: Self-pay | Source: Home / Self Care | Attending: Urology

## 2020-06-22 ENCOUNTER — Ambulatory Visit: Payer: Medicare Other | Admitting: Urgent Care

## 2020-06-22 ENCOUNTER — Encounter: Payer: Self-pay | Admitting: Urology

## 2020-06-22 ENCOUNTER — Ambulatory Visit
Admission: RE | Admit: 2020-06-22 | Discharge: 2020-06-22 | Disposition: A | Discharge status: Home / Self Care | Payer: Medicare Other | Attending: Urology | Admitting: Urology

## 2020-06-22 DIAGNOSIS — F32A Depression, unspecified: Secondary | ICD-10-CM | POA: Diagnosis not present

## 2020-06-22 DIAGNOSIS — M1990 Unspecified osteoarthritis, unspecified site: Secondary | ICD-10-CM | POA: Diagnosis not present

## 2020-06-22 DIAGNOSIS — I129 Hypertensive chronic kidney disease with stage 1 through stage 4 chronic kidney disease, or unspecified chronic kidney disease: Secondary | ICD-10-CM | POA: Diagnosis not present

## 2020-06-22 DIAGNOSIS — R310 Gross hematuria: Secondary | ICD-10-CM | POA: Diagnosis not present

## 2020-06-22 DIAGNOSIS — N329 Bladder disorder, unspecified: Secondary | ICD-10-CM | POA: Diagnosis present

## 2020-06-22 DIAGNOSIS — N400 Enlarged prostate without lower urinary tract symptoms: Secondary | ICD-10-CM | POA: Diagnosis not present

## 2020-06-22 DIAGNOSIS — Z8673 Personal history of transient ischemic attack (TIA), and cerebral infarction without residual deficits: Secondary | ICD-10-CM | POA: Insufficient documentation

## 2020-06-22 DIAGNOSIS — K219 Gastro-esophageal reflux disease without esophagitis: Secondary | ICD-10-CM | POA: Diagnosis not present

## 2020-06-22 DIAGNOSIS — Z87891 Personal history of nicotine dependence: Secondary | ICD-10-CM | POA: Insufficient documentation

## 2020-06-22 DIAGNOSIS — N3289 Other specified disorders of bladder: Secondary | ICD-10-CM

## 2020-06-22 DIAGNOSIS — Z8501 Personal history of malignant neoplasm of esophagus: Secondary | ICD-10-CM | POA: Diagnosis not present

## 2020-06-22 DIAGNOSIS — N189 Chronic kidney disease, unspecified: Secondary | ICD-10-CM | POA: Insufficient documentation

## 2020-06-22 DIAGNOSIS — Z86010 Personal history of colonic polyps: Secondary | ICD-10-CM | POA: Insufficient documentation

## 2020-06-22 DIAGNOSIS — I700 Atherosclerosis of aorta: Secondary | ICD-10-CM | POA: Insufficient documentation

## 2020-06-22 DIAGNOSIS — E7800 Pure hypercholesterolemia, unspecified: Secondary | ICD-10-CM | POA: Diagnosis not present

## 2020-06-22 DIAGNOSIS — N4289 Other specified disorders of prostate: Secondary | ICD-10-CM

## 2020-06-22 DIAGNOSIS — K22719 Barrett's esophagus with dysplasia, unspecified: Secondary | ICD-10-CM | POA: Diagnosis not present

## 2020-06-22 HISTORY — PX: TRANSURETHRAL RESECTION OF PROSTATE: SHX73

## 2020-06-22 SURGERY — TURP (TRANSURETHRAL RESECTION OF PROSTATE)
Anesthesia: General

## 2020-06-22 MED ORDER — PHENYLEPHRINE HCL (PRESSORS) 10 MG/ML IV SOLN
INTRAVENOUS | Status: DC | PRN
  Administered 2020-06-22: 100 ug via INTRAVENOUS
  Administered 2020-06-22 (×5): 200 ug via INTRAVENOUS

## 2020-06-22 MED ORDER — CHLORHEXIDINE GLUCONATE 0.12 % MT SOLN
OROMUCOSAL | Status: AC
  Administered 2020-06-22: 15 mL via OROMUCOSAL
  Filled 2020-06-22: qty 15

## 2020-06-22 MED ORDER — CEFAZOLIN SODIUM-DEXTROSE 2-4 GM/100ML-% IV SOLN
INTRAVENOUS | Status: AC
  Filled 2020-06-22: qty 100

## 2020-06-22 MED ORDER — ONDANSETRON HCL 4 MG/2ML IJ SOLN: 4.0000 mg | Freq: Once | INTRAMUSCULAR | Status: DC | PRN

## 2020-06-22 MED ORDER — FENTANYL CITRATE (PF) 100 MCG/2ML IJ SOLN: 25.0000 ug | INTRAMUSCULAR | Status: DC | PRN

## 2020-06-22 MED ORDER — PROPOFOL 10 MG/ML IV BOLUS
INTRAVENOUS | Status: DC | PRN
  Administered 2020-06-22: 100 mg via INTRAVENOUS

## 2020-06-22 MED ORDER — LIDOCAINE HCL (CARDIAC) PF 100 MG/5ML IV SOSY
PREFILLED_SYRINGE | INTRAVENOUS | Status: DC | PRN
  Administered 2020-06-22: 40 mg via INTRAVENOUS

## 2020-06-22 MED ORDER — ORAL CARE MOUTH RINSE: 15.0000 mL | Freq: Once | OROMUCOSAL | Status: AC

## 2020-06-22 MED ORDER — HYDROCODONE-ACETAMINOPHEN 5-325 MG PO TABS
1.0000 | ORAL_TABLET | Freq: Once | ORAL | Status: AC
  Administered 2020-06-22: 1 via ORAL

## 2020-06-22 MED ORDER — PROPOFOL 10 MG/ML IV BOLUS
INTRAVENOUS | Status: AC
  Filled 2020-06-22: qty 20

## 2020-06-22 MED ORDER — GLYCOPYRROLATE 0.2 MG/ML IJ SOLN
INTRAMUSCULAR | Status: DC | PRN
  Administered 2020-06-22 (×2): .2 mg via INTRAVENOUS

## 2020-06-22 MED ORDER — ONDANSETRON HCL 4 MG/2ML IJ SOLN
INTRAMUSCULAR | Status: DC | PRN
  Administered 2020-06-22: 4 mg via INTRAVENOUS

## 2020-06-22 MED ORDER — FENTANYL CITRATE (PF) 100 MCG/2ML IJ SOLN
INTRAMUSCULAR | Status: AC
  Filled 2020-06-22: qty 2

## 2020-06-22 MED ORDER — FENTANYL CITRATE (PF) 100 MCG/2ML IJ SOLN
INTRAMUSCULAR | Status: DC | PRN
  Administered 2020-06-22 (×2): 50 ug via INTRAVENOUS

## 2020-06-22 MED ORDER — CEFAZOLIN SODIUM-DEXTROSE 2-4 GM/100ML-% IV SOLN
2.0000 g | INTRAVENOUS | Status: AC
  Administered 2020-06-22: 2 g via INTRAVENOUS

## 2020-06-22 MED ORDER — CHLORHEXIDINE GLUCONATE 0.12 % MT SOLN: 15.0000 mL | Freq: Once | OROMUCOSAL | Status: AC

## 2020-06-22 MED ORDER — HYDROCODONE-ACETAMINOPHEN 5-325 MG PO TABS: 1.0000 | ORAL_TABLET | Freq: Four times a day (QID) | ORAL | 0 refills | Status: DC | PRN

## 2020-06-22 MED ORDER — LACTATED RINGERS IV SOLN: INTRAVENOUS | Status: DC

## 2020-06-22 MED ORDER — HYDROCODONE-ACETAMINOPHEN 5-325 MG PO TABS
ORAL_TABLET | ORAL | Status: AC
  Filled 2020-06-22: qty 1

## 2020-06-22 MED ORDER — DEXAMETHASONE SODIUM PHOSPHATE 10 MG/ML IJ SOLN
INTRAMUSCULAR | Status: DC | PRN
  Administered 2020-06-22: 10 mg via INTRAVENOUS

## 2020-06-22 SURGICAL SUPPLY — 28 items
BAG DRAIN CYSTO-URO LG1000N (MISCELLANEOUS) ×3 IMPLANT
BAG DRN RND TRDRP ANRFLXCHMBR (UROLOGICAL SUPPLIES) ×1
BAG URINE DRAIN 2000ML AR STRL (UROLOGICAL SUPPLIES) ×3 IMPLANT
CATH FOL 2WAY LX 20X30 (CATHETERS) ×3 IMPLANT
CATH FOLEY 2WAY 18X30 (CATHETERS) IMPLANT
CATH FOLEY 2WAY SIL 18X30 (CATHETERS)
DRAPE UTILITY 15X26 TOWEL STRL (DRAPES) ×3 IMPLANT
DRSG TELFA 4X3 1S NADH ST (GAUZE/BANDAGES/DRESSINGS) ×3 IMPLANT
ELECT BIVAP BIPO 22/24 DONUT (ELECTROSURGICAL) ×3
ELECT LOOP 22F BIPOLAR SML (ELECTROSURGICAL) ×3
ELECT REM PT RETURN 9FT ADLT (ELECTROSURGICAL)
ELECTRD BIVAP BIPO 22/24 DONUT (ELECTROSURGICAL) ×1 IMPLANT
ELECTRODE LOOP 22F BIPOLAR SML (ELECTROSURGICAL) ×1 IMPLANT
ELECTRODE REM PT RTRN 9FT ADLT (ELECTROSURGICAL) IMPLANT
GLOVE BIOGEL PI IND STRL 7.5 (GLOVE) ×1 IMPLANT
GLOVE BIOGEL PI INDICATOR 7.5 (GLOVE) ×2
GOWN STRL REUS W/ TWL LRG LVL3 (GOWN DISPOSABLE) ×1 IMPLANT
GOWN STRL REUS W/TWL LRG LVL3 (GOWN DISPOSABLE) ×3
GOWN STRL REUS W/TWL XL LVL4 (GOWN DISPOSABLE) ×3 IMPLANT
IV NS IRRIG 3000ML ARTHROMATIC (IV SOLUTION) ×15 IMPLANT
KIT TURNOVER CYSTO (KITS) ×3 IMPLANT
LOOP CUT BIPOLAR 24F LRG (ELECTROSURGICAL) IMPLANT
PACK CYSTO AR (MISCELLANEOUS) ×3 IMPLANT
SET IRRIG Y TYPE TUR BLADDER L (SET/KITS/TRAYS/PACK) ×3 IMPLANT
SURGILUBE 2OZ TUBE FLIPTOP (MISCELLANEOUS) ×3 IMPLANT
SYR TOOMEY IRRIG 70ML (MISCELLANEOUS) ×3
SYRINGE TOOMEY IRRIG 70ML (MISCELLANEOUS) ×1 IMPLANT
WATER STERILE IRR 1000ML POUR (IV SOLUTION) ×3 IMPLANT

## 2020-06-22 NOTE — Anesthesia Procedure Notes (Signed)
Procedure Name: LMA Insertion Performed by: Staci Acosta, CRNA Pre-anesthesia Checklist: Patient identified, Patient being monitored, Timeout performed, Emergency Drugs available and Suction available Patient Re-evaluated:Patient Re-evaluated prior to induction Oxygen Delivery Method: Circle system utilized Preoxygenation: Pre-oxygenation with 100% oxygen Induction Type: IV induction Ventilation: Mask ventilation without difficulty LMA: LMA inserted LMA Size: 4.0 Tube type: Oral Number of attempts: 1 Placement Confirmation: positive ETCO2 and breath sounds checked- equal and bilateral Tube secured with: Tape Dental Injury: Teeth and Oropharynx as per pre-operative assessment

## 2020-06-22 NOTE — Discharge Instructions (Signed)
AMBULATORY SURGERY  DISCHARGE INSTRUCTIONS   1) The drugs that you were given will stay in your system until tomorrow so for the next 24 hours you should not:  A) Drive an automobile B) Make any legal decisions C) Drink any alcoholic beverage   2) You may resume regular meals tomorrow.  Today it is better to start with liquids and gradually work up to solid foods.  You may eat anything you prefer, but it is better to start with liquids, then soup and crackers, and gradually work up to solid foods.   3) Please notify your doctor immediately if you have any unusual bleeding, trouble breathing, redness and pain at the surgery site, drainage, fever, or pain not relieved by medication.    4) Additional Instructions:        Please contact your physician with any problems or Same Day Surgery at [phone], Monday through Friday 6 am to 4 pm, or [place] at [facility] number at [phone].Transurethral Resection of the Prostate (TURP) or Greenlight laser ablation of the Prostate  Care After  Refer to this sheet in the next few weeks. These discharge instructions provide you with general information on caring for yourself after you leave the hospital. Your caregiver may also give you specific instructions. Your treatment has been planned according to the most current medical practices available, but unavoidable complications sometimes occur. If you have any problems or questions after discharge, please call your caregiver.  HOME CARE INSTRUCTIONS   Medications  You may receive medicine for pain management. As your level of discomfort decreases, adjustments in your pain medicines may be made.  A prescription for pain medication was sent to your pharmacy  Take all medicines as directed.   You may be given a medicine (antibiotic) to kill germs following surgery. Finish all medicines. Let your caregiver know if you have any side effects or problems from the medicine.   If you  are on aspirin, it would be best not to restart the aspirin until the blood in the urine clears  Resume Eliquis 24-48 hours when urine is clear Hygiene  You can take a shower after surgery.   You should not take a bath while you still have the urethral catheter. Activity  You will be encouraged to get out of bed as much as possible and increase your activity level as tolerated.   Spend the first week in and around your home. For 3 weeks, avoid the following:   Straining.   Running.   Strenuous work.   Walks longer than a few blocks.   Riding for extended periods.   Sexual relations.   Do not lift heavy objects (more than 20 pounds) for at least 1 month. When lifting, use your arms instead of your abdominal muscles.   You will be encouraged to walk as tolerated. Do not exert yourself. Increase your activity level slowly. Remember that it is important to keep moving after an operation of any type. This cuts down on the possibility of developing blood clots.   Your caregiver will tell you when you can resume driving and light housework. Discuss this at your first office visit after discharge. Diet  No special diet is ordered after a TURP. However, if you are on a special diet for another medical problem, it should be continued.   Normal fluid intake is usually recommended.   Avoid alcohol and caffeinated drinks for 2 weeks. They irritate the bladder. Decaffeinated drinks are okay.  Avoid spicy foods.  Bladder Function  For the first 10 days, empty the bladder whenever you feel a definite desire. Do not try to hold the urine for long periods of time.   Urinating once or twice a night even after you are healed is not uncommon.   You may see some recurrence of blood in the urine after discharge from the hospital. This usually happens within 2 weeks after the procedure.If this occurs, force fluids again as you did in the hospital and reduce your activity.  Bowel  Function  You may experience some constipation after surgery. This can be minimized by increasing fluids and fiber in your diet. Drink enough water and fluids to keep your urine clear or pale yellow.   A stool softener may be prescribed for use at home. Do not strain to move your bowels.   If you are requiring increased pain medicine, it is important that you take stool softeners to prevent constipation. This will help to promote proper healing by reducing the need to strain to move your bowels.  Sexual Activity  Semen movement in the opposite direction and into the bladder (retrograde ejaculation) may occur. Since the semen passes into the bladder, cloudy urine can occur the first time you urinate after intercourse. Or, you may not have an ejaculation during erection. Ask your caregiver when you can resume sexual activity. Retrograde ejaculation and reduced semen discharge should not reduce one's pleasure of intercourse.  Postoperative Visit  Arrange the date and time of your after surgery visit with your caregiver.  Return to Work  After your recovery is complete, you will be able to return to work and resume all activities. Your caregiver will inform you when you can return to work.    Foley Catheter Care A soft, flexible tube (Foley catheter) may have been placed in your bladder to drain urine and fluid. Follow these instructions: Taking Care of the Catheter  Keep the area where the catheter leaves your body clean.   Attach the catheter to the leg so there is no tension on the catheter.   Keep the drainage bag below the level of the bladder, but keep it OFF the floor.   Do not take long soaking baths. Your caregiver will give instructions about showering.   Wash your hands before touching ANYTHING related to the catheter or bag.   Using mild soap and warm water on a washcloth:   Clean the area closest to the catheter insertion site using a circular motion around the catheter.    Clean the catheter itself by wiping AWAY from the insertion site for several inches down the tube.   NEVER wipe upward as this could sweep bacteria up into the urethra (tube in your body that normally drains the bladder) and cause infection.   Place a small amount of sterile lubricant at the tip of the penis where the catheter is entering.  Taking Care of the Drainage Bags  Two drainage bags may be taken home: a large overnight drainage bag, and a smaller leg bag which fits underneath clothing.   It is okay to wear the overnight bag at any time, but NEVER wear the smaller leg bag at night.   Keep the drainage bag well below the level of your bladder. This prevents backflow of urine into the bladder and allows the urine to drain freely.   Anchor the tubing to your leg to prevent pulling or tension on the catheter. Use tape or a  leg strap provided by the hospital.   Empty the drainage bag when it is 1/2 to 3/4 full. Wash your hands before and after touching the bag.   Periodically check the tubing for kinks to make sure there is no pressure on the tubing which could restrict the flow of urine.  Changing the Drainage Bags  Cleanse both ends of the clean bag with alcohol before changing.   Pinch off the rubber catheter to avoid urine spillage during the disconnection.   Disconnect the dirty bag and connect the clean one.   Empty the dirty bag carefully to avoid a urine spill.   Attach the new bag to the leg with tape or a leg strap.  Cleaning the Drainage Bags  Whenever a drainage bag is disconnected, it must be cleaned quickly so it is ready for the next use.   Wash the bag in warm, soapy water.   Rinse the bag thoroughly with warm water.   Soak the bag for 30 minutes in a solution of white vinegar and water (1 cup vinegar to 1 quart warm water).   Rinse with warm water.  SEEK MEDICAL CARE IF:   You have chills or night sweats.   You are leaking around your catheter or  have problems with your catheter. It is not uncommon to have sporadic leakage around your catheter as a result of bladder spasms. If the leakage stops, there is not much need for concern. If you are uncertain, call your caregiver.   You develop side effects that you think are coming from your medicines.  SEEK IMMEDIATE MEDICAL CARE IF:   You are suddenly unable to urinate. Check to see if there are any kinks in the drainage tubing that may cause this. If you cannot find any kinks, call your caregiver immediately. This is an emergency.   You develop shortness of breath or chest pains.   Bleeding persists or clots develop in your urine.   You have a fever.   You develop pain in your back or over your lower belly (abdomen).   You develop pain or swelling in your legs.   Any problems you are having get worse rather than better.  MAKE SURE YOU:   Understand these instructions.   Will watch your condition.   Will get help right away if you are not doing well or get worse.

## 2020-06-22 NOTE — Anesthesia Postprocedure Evaluation (Signed)
Anesthesia Post Note  Patient: Blake Jenkins  Procedure(s) Performed: TRANSURETHRAL RESECTION OF THE PROSTATE (TURP) (N/A )  Patient location during evaluation: PACU Anesthesia Type: General Level of consciousness: awake and alert Pain management: pain level controlled Vital Signs Assessment: post-procedure vital signs reviewed and stable Respiratory status: spontaneous breathing, nonlabored ventilation, respiratory function stable and patient connected to nasal cannula oxygen Cardiovascular status: blood pressure returned to baseline and stable Postop Assessment: no apparent nausea or vomiting Anesthetic complications: no   No complications documented.   Last Vitals:  Vitals:   06/22/20 1615 06/22/20 1621  BP:  102/72  Pulse: (!) 57 61  Resp: 14 20  Temp: (!) 36.1 C (!) 36.1 C  SpO2: 98% 99%    Last Pain:  Vitals:   06/22/20 1630  TempSrc:   PainSc: 0-No pain                 Arita Miss

## 2020-06-22 NOTE — Interval H&P Note (Signed)
History and Physical Interval Note:  Lungs: Clear CV: RRR  06/22/2020 2:14 PM  Blake Jenkins  has presented today for surgery, with the diagnosis of hematuria, bladder mass.  The various methods of treatment have been discussed with the patient and family. After consideration of risks, benefits and other options for treatment, the patient has consented to  Procedure(s): TRANSURETHRAL RESECTION OF BLADDER TUMOR (TURBT) (N/A) as a surgical intervention.  The patient's history has been reviewed, patient examined, no change in status, stable for surgery.  I have reviewed the patient's chart and labs.  Questions were answered to the patient's satisfaction.     [place]

## 2020-06-22 NOTE — Transfer of Care (Signed)
Immediate Anesthesia Transfer of Care Note  Patient: Blake Jenkins  Procedure(s) Performed: TRANSURETHRAL RESECTION OF THE PROSTATE (TURP) (N/A )  Patient Location: PACU  Anesthesia Type:General  Level of Consciousness: drowsy and patient cooperative  Airway & Oxygen Therapy: Patient connected to face mask oxygen  Post-op Assessment: Report given to RN and Post -op Vital signs reviewed and stable  Post vital signs: Reviewed and stable  Last Vitals:  Vitals Value Taken Time  BP 101/67 06/22/20 1535  Temp    Pulse 59 06/22/20 1537  Resp 21 06/22/20 1537  SpO2 100 % 06/22/20 1537  Vitals shown include unvalidated device data.  Last Pain:  Vitals:   06/22/20 1330  TempSrc: Temporal  PainSc: 0-No pain         Complications: No complications documented.

## 2020-06-22 NOTE — Anesthesia Preprocedure Evaluation (Addendum)
Anesthesia Evaluation  Patient identified by MRN, date of birth, ID band Patient awake    Reviewed: Allergy & Precautions, NPO status , Patient's Chart, lab work & pertinent test results  History of Anesthesia Complications Negative for: history of anesthetic complications  Airway Mallampati: I       Dental  (+) Dental Advidsory Given, Caps, Teeth Intact   Pulmonary neg shortness of breath, neg sleep apnea, neg COPD, neg recent URI, former smoker,           Cardiovascular hypertension, Pt. on medications (-) angina(-) CAD, (-) Past MI, (-) Cardiac Stents, (-) CABG and (-) CHF + dysrhythmias Atrial Fibrillation (-) Valvular Problems/Murmurs     Neuro/Psych neg Seizures PSYCHIATRIC DISORDERS Depression CVA, No Residual Symptoms    GI/Hepatic Neg liver ROS, GERD  Medicated and Poorly Controlled,  Endo/Other  neg diabetes  Renal/GU Renal InsufficiencyRenal disease     Musculoskeletal   Abdominal   Peds  Hematology   Anesthesia Other Findings Past Medical History: No date: Arthritis     Comment:  RIGHT KNEE No date: Barrett's esophagus with dysplasia     Comment:  resection 2011 No date: BPH (benign prostatic hyperplasia) No date: Chronic kidney disease No date: Colon polyps No date: Depression No date: Dysrhythmia     Comment:  A-FIB,TRANSIENT No date: Elevated PSA 01/04/2018: Genetic testing     Comment:  Multi-Cancer panel (83 genes) @ [facility] - No pathogenic               mutations detected No date: GERD (gastroesophageal reflux disease) No date: History of esophageal cancer No date: Hypercholesteremia No date: Hypertension No date: Hypertrophy of prostate without urinary obstruction No date: Knee pain, bilateral No date: Shoulder pain, left No date: Stroke [facility])   Reproductive/Obstetrics                             Anesthesia Physical  Anesthesia Plan  ASA: II  Anesthesia  Plan: General   Post-op Pain Management:    Induction: Intravenous  PONV Risk Score and Plan: 2 and Propofol infusion and TIVA  Airway Management Planned: LMA  Additional Equipment:   Intra-op Plan:   Post-operative Plan: Extubation in OR  Informed Consent: I have reviewed the patients History and Physical, chart, labs and discussed the procedure including the risks, benefits and alternatives for the proposed anesthesia with the patient or authorized representative who has indicated his/her understanding and acceptance.       Plan Discussed with:   Anesthesia Plan Comments:        Anesthesia Quick Evaluation

## 2020-06-22 NOTE — Progress Notes (Signed)
Pateint verbalizes understanding of foley, how to care , empty, etc.

## 2020-06-23 ENCOUNTER — Encounter: Payer: Self-pay | Admitting: Urology

## 2020-06-23 ENCOUNTER — Ambulatory Visit (INDEPENDENT_AMBULATORY_CARE_PROVIDER_SITE_OTHER): Payer: Medicare Other | Admitting: Physician Assistant

## 2020-06-23 ENCOUNTER — Other Ambulatory Visit: Payer: Self-pay

## 2020-06-23 VITALS — BP 121/76 | HR 54 | Ht 69.0 in | Wt 160.0 lb

## 2020-06-23 DIAGNOSIS — N368 Other specified disorders of urethra: Secondary | ICD-10-CM

## 2020-06-23 NOTE — Progress Notes (Signed)
Catheter Removal  Patient is present today for a catheter removal.  76ml of water was drained from the balloon. A 20FR foley cath was removed from the bladder no complications were noted . Patient tolerated well.  Performed by: Debroah Loop, PA-C   Follow up/ Additional notes: As scheduled.

## 2020-06-23 NOTE — Op Note (Signed)
Preoperative diagnosis:  1. Bladder mass  Postoperative diagnosis:  1. 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 Transurethral resection prostate  Surgeon: Abbie Sons, MD  Anesthesia: General  Complications: None  Intraoperative findings:  1. Whitish left prostatic urethral mass extending towards midline and not the typical appearance of BPH; bullous edema anterior prostatic urethra; open bladder neck 2. Bladder normal in appearance without erythema, solid or papillary lesions  EBL: Minimal  Specimens: Prostate chips  Indication: EURA RADABAUGH is a 79 y.o. with a history of gross hematuria.  CTU without significant upper tract abnormalities and several small clustered filling defects anterior bladder.  Office cystoscopy was suboptimal however initially appeared to be bullous edema of the bladder and possible bladder mass.  After reviewing the management options for treatment, he elected to proceed with the above surgical procedure(s). We have discussed the potential benefits and risks of the procedure, side effects of the proposed treatment, the likelihood of the patient achieving the goals of the procedure, and any potential problems that might occur during the procedure or recuperation. Informed consent has been obtained.  Description of procedure:  The patient was taken to the operating room and general anesthesia was induced.  The patient was placed in the dorsal lithotomy position, prepped and draped in the usual sterile fashion, and preoperative antibiotics were administered. A preoperative time-out was performed.   A 26 French continuous-flow resectoscope sheath with visual obturator was lubricated and passed per urethra.  The urethra was normal in caliber without stricture.  Prostatic urethra findings as described above and through the area of bullous edema anteriorly the bladder neck was identified which was not seen with prior office cystoscopy.  Panendoscopy  was performed with findings as described above.  No bladder masses were identified in the anterior filling defect seen on CT were consistent with bullous edema at the anterior bladder neck.  An Iglesias resectoscope with bipolar loop was then placed into the resectoscope sheath and the prostatic urethral mass was resected.  No significant bleeding was noted.  All chips were removed from the bladder with irrigation.  The resection site was fulgurated and no bleeding was noted with the inflow off.  The resectoscope was removed and a 2 [place] Foley catheter was placed however upon irrigation moderate hematuria was noted.  The catheter was removed and the resectoscope was replaced.  A bleeding area was noted anteriorly which was fulgurated.  The resectoscope was again removed and the Foley catheter was replaced and the balloon inflated with 15 mL of sterile water.  The catheter was irrigated with return of pink-tinged effluent.  After anesthetic reversal he was transported to PACU in stable condition.  Plan: He will be discharged with indwelling Foley and office follow-up tomorrow for catheter removal.   Abbie Sons, M.D.

## 2020-06-24 LAB — SURGICAL PATHOLOGY

## 2020-06-30 ENCOUNTER — Telehealth (INDEPENDENT_AMBULATORY_CARE_PROVIDER_SITE_OTHER): Payer: Medicare Other | Admitting: Urology

## 2020-06-30 DIAGNOSIS — R310 Gross hematuria: Secondary | ICD-10-CM

## 2020-06-30 NOTE — Progress Notes (Signed)
Virtual Visit via Video Note  I connected with Blake Jenkins on 06/30/20 at  8:30 AM EDT by a video enabled telemedicine application and verified that I am speaking with the correct person using two identifiers.  Location: Patient: Home Provider: [facility]   I discussed the limitations of evaluation and management by telemedicine and the availability of in person appointments. The patient expressed understanding and agreed to proceed.  History of Present Illness: 79 y.o. male with a history of gross hematuria and questionable bladder mass on flexible cystoscopy in office.  Intraoperatively his bladder was clear however there was adenoma regrowth which was abnormal in appearance and bullous edema.  TURP was performed.    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  Recommended he call for increasing hematuria and may need to stop Eliquis for 1 to 2 days as needed  Follow Up Instructions: Follow-up 1 year or as needed   I discussed the assessment and treatment plan with the patient. The patient was provided an opportunity to ask questions and all were answered. The patient agreed with the plan and demonstrated an understanding of the instructions.   The patient was advised to call back or seek an in-person evaluation if the symptoms worsen or if the condition fails to improve as anticipated.  I provided 10 minutes of non-face-to-face time during this encounter.   Abbie Sons, MD

## 2020-07-06 ENCOUNTER — Telehealth: Payer: Self-pay | Admitting: Urology

## 2020-07-06 NOTE — Telephone Encounter (Signed)
App made and mailed °

## 2020-07-06 NOTE — Telephone Encounter (Signed)
-----   Message from Abbie Sons, MD sent at 07/05/2020 11:24 AM EDT ----- Regarding: Follow-up Please schedule I year follow-up with me

## 2020-07-27 ENCOUNTER — Ambulatory Visit (INDEPENDENT_AMBULATORY_CARE_PROVIDER_SITE_OTHER): Payer: Medicare Other | Admitting: Urology

## 2020-07-27 ENCOUNTER — Other Ambulatory Visit: Payer: Self-pay

## 2020-07-27 ENCOUNTER — Encounter: Payer: Self-pay | Admitting: Urology

## 2020-07-27 VITALS — BP 112/68 | HR 72 | Ht 69.0 in | Wt 160.0 lb

## 2020-07-27 DIAGNOSIS — R350 Frequency of micturition: Secondary | ICD-10-CM

## 2020-07-27 LAB — BLADDER SCAN AMB NON-IMAGING: Scan Result: 2

## 2020-07-27 MED ORDER — SULFAMETHOXAZOLE-TRIMETHOPRIM 800-160 MG PO TABS: 1.0000 | ORAL_TABLET | Freq: Two times a day (BID) | ORAL | 0 refills | Status: DC

## 2020-07-27 NOTE — Progress Notes (Signed)
07/27/2020 6:54 PM   Blake Jenkins 1941/01/20 [id number]  Referring provider: Gladstone Lighter, MD [place],  [place] [address]  Chief Complaint  Patient presents with  . Urinary Frequency    HPI: Blake Jenkins is a 79 y.o. male who is s/p TURP with Dr. Bernardo Heater on 06/22/2020 who presented to the front desk requesting a refill on his Bactrim.  Patient developed frequency, urgency and dysuria over the weekend.  He had 6 tablets left over from his prescription in September that he has started.  He states the dysuria is improving.  He is having urgency and frequency every 30 minutes.  He states the gross hematuria has resolved.  Patient denies any modifying or aggravating factors.  Patient denies any gross hematuria or suprapubic/flank pain.  Patient denies any fevers, chills, nausea or vomiting.   His PVR is 2 mL.  His UA is positive for 11-30 WBCs and greater than 30 RBCs.  PMH: Past Medical History:  Diagnosis Date  . A-fib ([place])   . Arthritis    RIGHT KNEE  . Barrett's esophagus with dysplasia    resection 2011  . BPH (benign prostatic hyperplasia)   . 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 Chronic kidney disease    stage 3  . Colon polyps   . Depression   . Dysrhythmia    A-FIB,TRANSIENT  . Elevated PSA   . Genetic testing 01/04/2018   Multi-Cancer panel (83 genes) @ [facility] - No pathogenic mutations detected  . GERD (gastroesophageal reflux disease)   . History of esophageal cancer   . Hypercholesteremia   . Hypertension   . Hypertrophy of prostate without urinary obstruction   . Knee pain, bilateral   . Shoulder pain, left   . 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 ADENOIDECTOMY    . COLONOSCOPY WITH PROPOFOL N/A 01/30/2017   Procedure: COLONOSCOPY WITH PROPOFOL;  Surgeon: Lollie Sails, MD;  Location: [facility] ENDOSCOPY;  Service: Endoscopy;  Laterality:  N/A;  . COLONOSCOPY WITH PROPOFOL N/A 12/20/2017   Procedure: COLONOSCOPY WITH PROPOFOL;  Surgeon: Lollie Sails, MD;  Location: [facility] ENDOSCOPY;  Service: Endoscopy;  Laterality: N/A;  . CYSTOSCOPY W/ RETROGRADES Bilateral 04/30/2018   Procedure: CYSTOSCOPY WITH RETROGRADE PYELOGRAM;  Surgeon: Abbie Sons, MD;  Location: [facility] ORS;  Service: Urology;  Laterality: Bilateral;  . ESOPHAGEAL RESECTION    . ESOPHAGOGASTRODUODENOSCOPY (EGD) WITH PROPOFOL N/A 03/04/2018   Procedure: ESOPHAGOGASTRODUODENOSCOPY (EGD) WITH PROPOFOL;  Surgeon: Lollie Sails, MD;  Location: [facility] ENDOSCOPY;  Service: Endoscopy;  Laterality: N/A;  . ESOPHAGOGASTRODUODENOSCOPY (EGD) WITH PROPOFOL N/A 04/04/2018   Procedure: ESOPHAGOGASTRODUODENOSCOPY (EGD) WITH PROPOFOL;  Surgeon: Lollie Sails, MD;  Location: [facility] ENDOSCOPY;  Service: Endoscopy;  Laterality: N/A;  . KNEE ARTHROSCOPY Left   . PROSTATE SURGERY    . TONSILLECTOMY     age 49  . TRANSURETHRAL RESECTION OF BLADDER TUMOR N/A 04/30/2018   Procedure: TRANSURETHRAL RESECTION OF BLADDER TUMOR (TURBT);  Surgeon: Abbie Sons, MD;  Location: [facility] ORS;  Service: Urology;  Laterality: N/A;  . TRANSURETHRAL RESECTION OF PROSTATE N/A 06/22/2020   Procedure: TRANSURETHRAL RESECTION OF THE PROSTATE (TURP);  Surgeon: Abbie Sons, MD;  Location: [facility] ORS;  Service: Urology;  Laterality: N/A;  . TURP VAPORIZATION  05/10/2011    Home Medications:  Allergies as of 07/27/2020  No Known Allergies     Medication List       Accurate as of July 27, 2020 11:59 PM. If you have any questions, ask your nurse or doctor.        atorvastatin 20 MG tablet Commonly known as: LIPITOR Take 20 mg by mouth every evening.   calcium carbonate 500 MG chewable tablet Commonly known as: TUMS - dosed in mg elemental calcium Chew 1 tablet by mouth 3 (three) times daily as needed for indigestion or heartburn.   Eliquis 5 MG Tabs tablet Generic drug: apixaban Take  5 mg by mouth 2 (two) times daily.   HYDROcodone-acetaminophen 5-325 MG tablet Commonly known as: NORCO/VICODIN Take 1 tablet by mouth every 6 (six) hours as needed for moderate pain.   lisinopril 5 MG tablet Commonly known as: ZESTRIL Take 5 mg by mouth at bedtime.   metoprolol succinate 25 MG 24 hr tablet Commonly known as: TOPROL-XL Take 25 mg by mouth daily at 12 noon.   pantoprazole 40 MG tablet Commonly known as: PROTONIX Take 40 mg by mouth 2 (two) times daily.   sertraline 100 MG tablet Commonly known as: ZOLOFT Take 100 mg by mouth every evening.   sildenafil 100 MG tablet Commonly known as: VIAGRA Take 100 mg by mouth daily as needed for erectile dysfunction.   sucralfate 1 g tablet Commonly known as: CARAFATE Take 1 g by mouth 3 (three) times daily as needed (indigestion/heartburn.).   sulfamethoxazole-trimethoprim 800-160 MG tablet Commonly known as: BACTRIM DS Take 1 tablet by mouth every 12 (twelve) hours. Started by: Zara Council, PA-C   Vitamin D 50 MCG (2000 UT) tablet Take 4,000 Units by mouth daily.       Allergies: No Known Allergies  Family History: Family History  Problem Relation Age of Onset  . Coronary artery disease Mother   . Alcohol abuse Father   . Prostate cancer Neg Hx   . Bladder Cancer Neg Hx   . Kidney cancer Neg Hx     Social History:  reports that he quit smoking about 48 years ago. His smoking use included cigarettes. He has a 2.00 pack-year smoking history. He has never used smokeless tobacco. He reports current alcohol use. He reports that he does not use drugs.  ROS: Pertinent ROS in HPI  Physical Exam: BP 112/68   Pulse 72   Ht 5\' 9"  (1.753 m)   Wt 160 lb (72.6 kg)   BMI 23.63 kg/m   Constitutional:  Well nourished. Alert and oriented, No acute distress. HEENT: [place] AT, mask in place.  Trachea midline. Cardiovascular: No clubbing, cyanosis, or edema. Respiratory: Normal respiratory effort, no increased work  of breathing. Neurologic: Grossly intact, no focal deficits, moving all 4 extremities. Psychiatric: Normal mood and affect.  Laboratory Data: Lab Results  Component Value Date   WBC 8.4 05/19/2020   HGB 15.5 05/19/2020   HCT 43.3 05/19/2020   MCV 92.3 05/19/2020   PLT 196 05/19/2020    Lab Results  Component Value Date   CREATININE 1.70 (H) 05/12/2020   Urinalysis Component     Latest Ref Rng & Units 07/27/2020  Specific Gravity, UA     1.005 - 1.030 >1.030 (H)  pH, UA     5.0 - 7.5 5.5  Color, UA     Yellow Orange  Appearance Ur     Clear Cloudy (A)  Leukocytes,UA     Negative 1+ (A)  Protein,UA     Negative/Trace 2+ (  A)  Glucose, UA     Negative Trace (A)  Ketones, UA     Negative Trace (A)  RBC, UA     Negative 3+ (A)  Bilirubin, UA     Negative Negative  Urobilinogen, Ur     0.2 - 1.0 mg/dL 1.0  Nitrite, UA     Negative Negative  Microscopic Examination      See below:   Component     Latest Ref Rng & Units 07/27/2020  WBC, UA     0 - 5 /hpf 11-30 (A)  RBC     0 - 2 /hpf >30 (A)  Epithelial Cells (non renal)     0 - 10 /hpf 0-10  Renal Epithel, UA     None seen /hpf 0-10 (A)  Casts     None seen /lpf Present (A)  Cast Type     N/A Granular casts (A)  Crystals     N/A Present (A)  Crystal Type     N/A Amorphous Sediment  Bacteria, UA     None seen/Few None seen   I have reviewed the labs.   Pertinent Imaging: Results for KIRKLIN, MCDUFFEE (MRN [id number]) as of 08/15/2020 18:50  Ref. Range 07/27/2020 16:03  Scan Result Unknown 2    Assessment & Plan:    1. Urinary frequency - Urinalysis, Complete - CULTURE, URINE COMPREHENSIVE - Bladder Scan (Post Void Residual) in office - UA is suspicious for infection - will start Septra DS until culture is available and will adjust if necessary - follow up pending urine culture results   Return for pending urine culture results .  These notes generated with voice recognition software. I apologize  for typographical errors.  Zara Council, PA-C  [facility] [address]  [place], [place] [address] [phone]

## 2020-07-28 LAB — URINALYSIS, COMPLETE
Bilirubin, UA: NEGATIVE
Nitrite, UA: NEGATIVE
Specific Gravity, UA: >1.03 — ABNORMAL HIGH (ref 1.005–1.030)
Urobilinogen, Ur: 1 mg/dL (ref 0.2–1.0)
pH, UA: 5.5 (ref 5.0–7.5)

## 2020-07-28 LAB — MICROSCOPIC EXAMINATION
Bacteria, UA: NONE SEEN
RBC, Urine: >30 /hpf — AB (ref 0–2)

## 2020-07-31 LAB — CULTURE, URINE COMPREHENSIVE

## 2020-08-02 ENCOUNTER — Telehealth: Payer: Self-pay | Admitting: Family Medicine

## 2020-08-02 MED ORDER — NITROFURANTOIN MONOHYD MACRO 100 MG PO CAPS: 100.0000 mg | ORAL_CAPSULE | Freq: Two times a day (BID) | ORAL | 0 refills | Status: DC

## 2020-08-02 NOTE — Telephone Encounter (Signed)
-----   Message from Nori Riis, PA-C sent at 08/02/2020 10:13 AM EST ----- Please let Blake Jenkins know that his urine culture grew out a small amount of bacteria which is resistant to the Septra.  We need to switch him to nitrofurantoin 100 mg twice daily x 7 days.   We also need to recheck his urine to ensure the micro heme resolves after taking the antibiotic.

## 2020-08-02 NOTE — Telephone Encounter (Signed)
Patient notified and ABX sent to pharmacy.  

## 2020-08-02 NOTE — Telephone Encounter (Signed)
-----   Message from Nori Riis, PA-C sent at 08/02/2020 10:13 AM EST ----- Please let Mr. Blake Jenkins know that his urine culture grew out a small amount of bacteria which is resistant to the Septra.  We need to switch him to nitrofurantoin 100 mg twice daily x 7 days.   We also need to recheck his urine to ensure the micro heme resolves after taking the antibiotic.

## 2020-08-16 NOTE — Progress Notes (Signed)
08/17/2020 7:42 PM   Blake Jenkins Apr 09, 1941 [id number]  Referring provider: Gladstone Lighter, MD [place],  [place] [address]  Chief Complaint  Patient presents with  . Hematuria    HPI: Blake Jenkins is a 79 y.o. male who is s/p TURP with Dr. Bernardo Heater on 06/22/2020 who presents today for follow up after an UTI.    He was seen by me on 07/27/2020 for symptoms of gross hematuria, frequency, urgency and dysuria that developed over the weekend.  He had 6 tablets left over from his prescription in September that he has started.  He stated the dysuria was improving.  He was having urgency and frequency every 30 minutes.  He stated the gross hematuria has resolved.  His PVR was 2 mL.  His UA was positive for 11-30 WBCs and greater than 30 RBCs.  He was started on Septra DS.  His urine culture was positive for 3,000 colonies of Staphylococcus hemolyticus resistant to ciprofloxacin, levofloxacin, moxifloxacin, oxacillin, penicillin, tetracycline and trimethoprim/sulfa, so he was changed to nitrofurantoin for which it was sensitive.  He continues to have frequency, urgency and gross hematuria.  Patient denies any modifying or aggravating factors.  Patient denies any gross hematuria or suprapubic/flank pain.  Patient denies any fevers, chills, nausea or vomiting.   His UA is positive for nitrates, 11-30 WBCs, greater than 30 RBCs and few bacteria.   PMH: Past Medical History:  Diagnosis Date  . A-fib ([place])   . Arthritis    RIGHT KNEE  . Barrett's esophagus with dysplasia    resection 2011  . BPH (benign prostatic hyperplasia)   . 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 Chronic kidney disease    stage 3  . Colon polyps   . Depression   . Dysrhythmia    A-FIB,TRANSIENT  . Elevated PSA   . Genetic testing 01/04/2018   Multi-Cancer panel (83 genes) @ [facility] - No pathogenic mutations detected  . GERD (gastroesophageal reflux disease)   . History of  esophageal cancer   . Hypercholesteremia   . Hypertension   . Hypertrophy of prostate without urinary obstruction   . Knee pain, bilateral   . Shoulder pain, left   . 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 ADENOIDECTOMY    . COLONOSCOPY WITH PROPOFOL N/A 01/30/2017   Procedure: COLONOSCOPY WITH PROPOFOL;  Surgeon: Lollie Sails, MD;  Location: [facility] ENDOSCOPY;  Service: Endoscopy;  Laterality: N/A;  . COLONOSCOPY WITH PROPOFOL N/A 12/20/2017   Procedure: COLONOSCOPY WITH PROPOFOL;  Surgeon: Lollie Sails, MD;  Location: [facility] ENDOSCOPY;  Service: Endoscopy;  Laterality: N/A;  . CYSTOSCOPY W/ RETROGRADES Bilateral 04/30/2018   Procedure: CYSTOSCOPY WITH RETROGRADE PYELOGRAM;  Surgeon: Abbie Sons, MD;  Location: [facility] ORS;  Service: Urology;  Laterality: Bilateral;  . ESOPHAGEAL RESECTION    . ESOPHAGOGASTRODUODENOSCOPY (EGD) WITH PROPOFOL N/A 03/04/2018   Procedure: ESOPHAGOGASTRODUODENOSCOPY (EGD) WITH PROPOFOL;  Surgeon: Lollie Sails, MD;  Location: [facility] ENDOSCOPY;  Service: Endoscopy;  Laterality: N/A;  . ESOPHAGOGASTRODUODENOSCOPY (EGD) WITH PROPOFOL N/A 04/04/2018   Procedure: ESOPHAGOGASTRODUODENOSCOPY (EGD) WITH PROPOFOL;  Surgeon: Lollie Sails, MD;  Location: [facility] ENDOSCOPY;  Service: Endoscopy;  Laterality: N/A;  . KNEE ARTHROSCOPY Left   . PROSTATE SURGERY    . TONSILLECTOMY     age 36  . TRANSURETHRAL RESECTION OF BLADDER TUMOR N/A  04/30/2018   Procedure: TRANSURETHRAL RESECTION OF BLADDER TUMOR (TURBT);  Surgeon: Abbie Sons, MD;  Location: [facility] ORS;  Service: Urology;  Laterality: N/A;  . TRANSURETHRAL RESECTION OF PROSTATE N/A 06/22/2020   Procedure: TRANSURETHRAL RESECTION OF THE PROSTATE (TURP);  Surgeon: Abbie Sons, MD;  Location: [facility] ORS;  Service: Urology;  Laterality: N/A;  . TURP VAPORIZATION  05/10/2011    Home Medications:  Allergies as of 08/17/2020   No Known  Allergies     Medication List       Accurate as of August 17, 2020 11:59 PM. If you have any questions, ask your nurse or doctor.        STOP taking these medications   HYDROcodone-acetaminophen 5-325 MG tablet Commonly known as: NORCO/VICODIN Stopped by: Gigi Onstad, PA-C   lisinopril 5 MG tablet Commonly known as: ZESTRIL Stopped by: Bohdi Leeds, PA-C   nitrofurantoin (macrocrystal-monohydrate) 100 MG capsule Commonly known as: MACROBID Stopped by: Harjas Biggins, PA-C   sildenafil 100 MG tablet Commonly known as: VIAGRA Stopped by: Opie Maclaughlin, PA-C   sulfamethoxazole-trimethoprim 800-160 MG tablet Commonly known as: BACTRIM DS Stopped by: Calyn Rubi, PA-C     TAKE these medications   atorvastatin 20 MG tablet Commonly known as: LIPITOR Take 20 mg by mouth every evening.   calcium carbonate 500 MG chewable tablet Commonly known as: TUMS - dosed in mg elemental calcium Chew 1 tablet by mouth 3 (three) times daily as needed for indigestion or heartburn.   Eliquis 5 MG Tabs tablet Generic drug: apixaban Take 5 mg by mouth 2 (two) times daily.   Gemtesa 75 MG Tabs Generic drug: Vibegron Take 75 mg by mouth daily. Started by: Zara Council, PA-C   metoprolol succinate 25 MG 24 hr tablet Commonly known as: TOPROL-XL Take 25 mg by mouth daily at 12 noon.   pantoprazole 40 MG tablet Commonly known as: PROTONIX Take 40 mg by mouth 2 (two) times daily.   sertraline 100 MG tablet Commonly known as: ZOLOFT Take 100 mg by mouth every evening.   sucralfate 1 g tablet Commonly known as: CARAFATE Take 1 g by mouth 3 (three) times daily as needed (indigestion/heartburn.).   Vitamin D 50 MCG (2000 UT) tablet Take 4,000 Units by mouth daily.       Allergies: No Known Allergies  Family History: Family History  Problem Relation Age of Onset  . Coronary artery disease Mother   . Alcohol abuse Father   . Prostate cancer Neg Hx   .  Bladder Cancer Neg Hx   . Kidney cancer Neg Hx     Social History:  reports that he quit smoking about 48 years ago. His smoking use included cigarettes. He has a 2.00 pack-year smoking history. He has never used smokeless tobacco. He reports current alcohol use. He reports that he does not use drugs.  ROS: Pertinent ROS in HPI  Physical Exam: BP 121/84   Pulse (!) 59   Ht 5\' 9"  (1.753 m)   Wt 160 lb (72.6 kg)   BMI 23.63 kg/m   Constitutional:  Well nourished. Alert and oriented, No acute distress. HEENT: [place] AT, mask in place.  Trachea midline Cardiovascular: No clubbing, cyanosis, or edema. Respiratory: Normal respiratory effort, no increased work of breathing. Neurologic: Grossly intact, no focal deficits, moving all 4 extremities. Psychiatric: Normal mood and affect.  Laboratory Data: Lab Results  Component Value Date   WBC 8.4 05/19/2020   HGB 15.5 05/19/2020   HCT  43.3 05/19/2020   MCV 92.3 05/19/2020   PLT 196 05/19/2020    Lab Results  Component Value Date   CREATININE 1.70 (H) 05/12/2020   Urinalysis Component     Latest Ref Rng & Units 08/17/2020  Specific Gravity, UA     1.005 - 1.030 1.020  pH, UA     5.0 - 7.5 5.5  Color, UA     Yellow Red (A)  Appearance Ur     Clear Cloudy (A)  Leukocytes,UA     Negative 3+ (A)  Protein,UA     Negative/Trace 3+ (A)  Glucose, UA     Negative Trace (A)  Ketones, UA     Negative 1+ (A)  RBC, UA     Negative 3+ (A)  Bilirubin, UA     Negative Negative  Urobilinogen, Ur     0.2 - 1.0 mg/dL 1.0  Nitrite, UA     Negative Positive (A)  Microscopic Examination      See below:   Component     Latest Ref Rng & Units 08/17/2020  WBC, UA     0 - 5 /hpf 11-30 (A)  RBC     0 - 2 /hpf >30 (A)  Epithelial Cells (non renal)     0 - 10 /hpf 0-10  Crystals     N/A Present (A)  Crystal Type     N/A Amorphous Sediment  Bacteria, UA     None seen/Few Few   I have reviewed the labs.   Pertinent  Imaging: Results for ZEKI, BEDROSIAN (MRN [id number]) as of 08/15/2020 18:50  Ref. Range 07/27/2020 16:03  Scan Result Unknown 2    Assessment & Plan:    1. UTI - Urinalysis, Complete - Patient did not have any improvement with his symptoms after being on antibiotics and today's urinalysis  - will send urine for culture as it continues to look infected - started Septra DS empirically, will adjust if necessary once culture results return - if culture returns negative, may need repeat cysto to rule out stricture   Return for Pending urine culture results .  These notes generated with voice recognition software. I apologize for typographical errors.  Zara Council, PA-C  [facility] [address]  [place], [place] [address] [phone]

## 2020-08-17 ENCOUNTER — Other Ambulatory Visit: Payer: Self-pay

## 2020-08-17 ENCOUNTER — Encounter: Payer: Self-pay | Admitting: Urology

## 2020-08-17 ENCOUNTER — Ambulatory Visit (INDEPENDENT_AMBULATORY_CARE_PROVIDER_SITE_OTHER): Payer: Medicare Other | Admitting: Urology

## 2020-08-17 VITALS — BP 121/84 | HR 59 | Ht 69.0 in | Wt 160.0 lb

## 2020-08-17 DIAGNOSIS — N3001 Acute cystitis with hematuria: Secondary | ICD-10-CM

## 2020-08-17 MED ORDER — GEMTESA 75 MG PO TABS: 75.0000 mg | ORAL_TABLET | Freq: Every day | ORAL | 0 refills | Status: DC

## 2020-08-20 LAB — URINALYSIS, COMPLETE
Bilirubin, UA: NEGATIVE
Nitrite, UA: POSITIVE — AB
Specific Gravity, UA: 1.02 (ref 1.005–1.030)
Urobilinogen, Ur: 1 mg/dL (ref 0.2–1.0)
pH, UA: 5.5 (ref 5.0–7.5)

## 2020-08-20 LAB — MICROSCOPIC EXAMINATION: RBC, Urine: >30 /hpf — AB (ref 0–2)

## 2020-08-21 LAB — CULTURE, URINE COMPREHENSIVE

## 2020-08-23 ENCOUNTER — Telehealth: Payer: Self-pay | Admitting: Family Medicine

## 2020-08-23 MED ORDER — SULFAMETHOXAZOLE-TRIMETHOPRIM 800-160 MG PO TABS: 1.0000 | ORAL_TABLET | Freq: Two times a day (BID) | ORAL | 0 refills | Status: DC

## 2020-08-23 NOTE — Telephone Encounter (Signed)
Patient notified and voiced understanding. ABX sent to pharmacy.  

## 2020-08-23 NOTE — Telephone Encounter (Signed)
-----   Message from Nori Riis, PA-C sent at 08/23/2020  7:39 AM EST ----- Please let Mr. Henricks know that his urine culture was positive for an infection.  I would like him to start Septra DS, twice daily for seven days.

## 2020-10-04 ENCOUNTER — Telehealth: Payer: Self-pay | Admitting: Urology

## 2020-10-04 NOTE — Telephone Encounter (Signed)
Spoke with Blake Jenkins last Monday and he stated he symptoms had completely resolved.

## 2021-02-28 ENCOUNTER — Other Ambulatory Visit: Payer: Self-pay

## 2021-02-28 ENCOUNTER — Ambulatory Visit
Admission: EM | Admit: 2021-02-28 | Discharge: 2021-02-28 | Disposition: A | Discharge status: Home / Self Care | Payer: Medicare Other

## 2021-02-28 DIAGNOSIS — S51811A Laceration without foreign body of right forearm, initial encounter: Secondary | ICD-10-CM

## 2021-02-28 MED ORDER — CEPHALEXIN 500 MG PO CAPS: 500.0000 mg | ORAL_CAPSULE | Freq: Two times a day (BID) | ORAL | 0 refills | Status: AC

## 2021-02-28 MED ORDER — TETANUS-DIPHTHERIA TOXOIDS TD 5-2 LFU IM INJ
0.5000 mL | INJECTION | Freq: Once | INTRAMUSCULAR | Status: AC
  Administered 2021-02-28: 0.5 mL via INTRAMUSCULAR

## 2021-02-28 NOTE — ED Triage Notes (Signed)
Pt c/o laceration to the right forearm today with a fan blade. Pt does not recall his last Tetanus.

## 2021-02-28 NOTE — ED Provider Notes (Signed)
[facility]    CSN: [id number] Arrival date & time: 02/28/21  1033      History   Chief Complaint Chief Complaint  Patient presents with  . Laceration    HPI Blake Jenkins is a 80 y.o. male.   HPI  80 year old male here for evaluation of laceration to right forearm.  Patient reports that he was putting up a new metal box fan for his horses, took the [place] off to expose the metal blades, and then was cut by the fan blade when the fan dropped.  The fan was not spinning at the time as it had not been connected yet.  Patient reports he is on aware of when his last tetanus shot was but he came in because he was concerned that it would not stop bleeding.  Patient does take Eliquis.  Patient denies any numbness, tingling, or weakness in his extremities, the bleeding has stopped with direct pressure application.  Past Medical History:  Diagnosis Date  . A-fib ([place])   . Arthritis    RIGHT KNEE  . Barrett's esophagus with dysplasia    resection 2011  . BPH (benign prostatic hyperplasia)   . Cancer [facility])    January 2001 Esophageal Cancer in situ  . Chronic kidney disease    stage 3  . Colon polyps   . Depression   . Dysrhythmia    A-FIB,TRANSIENT  . Elevated PSA   . Genetic testing 01/04/2018   Multi-Cancer panel (83 genes) @ [facility] - No pathogenic mutations detected  . GERD (gastroesophageal reflux disease)   . History of esophageal cancer   . Hypercholesteremia   . Hypertension   . Hypertrophy of prostate without urinary obstruction   . Knee pain, bilateral   . Shoulder pain, left   . 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 Atrial fibrillation, new onset ([place]) 02/14/2018  . CKD (chronic kidney disease) stage 3, GFR 30-59 ml/min (HCC) 02/14/2018  . Weight loss, unintentional 02/14/2018  . Genetic testing 01/04/2018  . History of esophageal cancer   . Colon polyps   . Adenomatous polyp of colon 08/14/2017   . Pain of left shoulder region 09/07/2016  . Primary osteoarthritis of right knee 09/16/2015  . History of hematuria 12/22/2014  . S/P TURP (status post transurethral resection of prostate) 12/22/2014  . Depression 10/11/2012  . Hypercholesterolemia 10/11/2012  . Barrett's esophagus with dysplasia 04/15/2012  . BPH (benign prostatic hyperplasia) 04/15/2012  . Elevated PSA 04/15/2012  . HTN (hypertension) 04/15/2012  . HTN (hypertension), malignant 04/15/2012  . Knee pain 04/15/2012  . Encounter for other specified special examinations 04/15/2012    Past Surgical History:  Procedure Laterality Date  . ADENOIDECTOMY    . COLONOSCOPY WITH PROPOFOL N/A 01/30/2017   Procedure: COLONOSCOPY WITH PROPOFOL;  Surgeon: Lollie Sails, MD;  Location: [facility] ENDOSCOPY;  Service: Endoscopy;  Laterality: N/A;  . COLONOSCOPY WITH PROPOFOL N/A 12/20/2017   Procedure: COLONOSCOPY WITH PROPOFOL;  Surgeon: Lollie Sails, MD;  Location: [facility] ENDOSCOPY;  Service: Endoscopy;  Laterality: N/A;  . CYSTOSCOPY W/ RETROGRADES Bilateral 04/30/2018   Procedure: CYSTOSCOPY WITH RETROGRADE PYELOGRAM;  Surgeon: Abbie Sons, MD;  Location: [facility] ORS;  Service: Urology;  Laterality: Bilateral;  . ESOPHAGEAL RESECTION    . ESOPHAGOGASTRODUODENOSCOPY (EGD) WITH PROPOFOL N/A 03/04/2018   Procedure: ESOPHAGOGASTRODUODENOSCOPY (EGD) WITH PROPOFOL;  Surgeon: Lollie Sails, MD;  Location: [facility] ENDOSCOPY;  Service: Endoscopy;  Laterality: N/A;  . ESOPHAGOGASTRODUODENOSCOPY (EGD) WITH PROPOFOL N/A 04/04/2018   Procedure: ESOPHAGOGASTRODUODENOSCOPY (EGD) WITH PROPOFOL;  Surgeon: Lollie Sails, MD;  Location[facility] ENDOSCOPY;  Service: Endoscopy;  Laterality: N/A;  . KNEE ARTHROSCOPY Left   . PROSTATE SURGERY    . TONSILLECTOMY     age 83  . TRANSURETHRAL RESECTION OF BLADDER TUMOR N/A 04/30/2018   Procedure: TRANSURETHRAL RESECTION OF BLADDER TUMOR (TURBT);  Surgeon: Abbie Sons, MD;  Location: [facility] ORS;   Service: Urology;  Laterality: N/A;  . TRANSURETHRAL RESECTION OF PROSTATE N/A 06/22/2020   Procedure: TRANSURETHRAL RESECTION OF THE PROSTATE (TURP);  Surgeon: Abbie Sons, MD;  Location: [facility] ORS;  Service: Urology;  Laterality: N/A;  . TURP VAPORIZATION  05/10/2011       Home Medications    Prior to Admission medications   Medication Sig Start Date End Date Taking? Authorizing Provider  apixaban (ELIQUIS) 5 MG TABS tablet Take 5 mg by mouth 2 (two) times daily.   Yes [provider]  atorvastatin (LIPITOR) 20 MG tablet Take by mouth. 12/13/20  Yes [provider]  cephALEXin (KEFLEX) 500 MG capsule Take 1 capsule (500 mg total) by mouth 2 (two) times daily for 5 days. 02/28/21 03/05/21 Yes Margarette Canada, NP  Cholecalciferol (VITAMIN D) 2000 units tablet Take 4,000 Units by mouth daily.    Yes [provider]  lisinopril (ZESTRIL) 5 MG tablet Take 5 mg by mouth daily. 12/13/20  Yes [provider]  metoprolol succinate (TOPROL-XL) 25 MG 24 hr tablet Take 25 mg by mouth daily at 12 noon.  06/04/20  Yes [provider]  pantoprazole (PROTONIX) 40 MG tablet Take 40 mg by mouth 2 (two) times daily.   Yes [provider]  sertraline (ZOLOFT) 100 MG tablet Take 100 mg by mouth every evening.    Yes [provider]  sucralfate (CARAFATE) 1 g tablet Take 1 g by mouth 3 (three) times daily as needed (indigestion/heartburn.).  02/26/20  Yes [provider]  calcium carbonate (TUMS - DOSED IN MG ELEMENTAL CALCIUM) 500 MG chewable tablet Chew 1 tablet by mouth 3 (three) times daily as needed for indigestion or heartburn.     [provider]    Family History Family History  Problem Relation Age of Onset  . Coronary artery disease Mother   . Alcohol abuse Father   . Prostate cancer Neg Hx   . Bladder Cancer Neg Hx   . Kidney cancer Neg Hx     Social History Social History   Tobacco Use  . Smoking status: Former     Packs/day: 1.00    Years: 2.00    Pack years: 2.00    Types: Cigarettes    Quit date: 04/15/1972    Years since quitting: 48.9  . Smokeless tobacco: Never  Vaping Use  . Vaping Use: Never used  Substance Use Topics  . Alcohol use: Yes    Comment: wine weekly   . Drug use: Never     Allergies   Patient has no known allergies.   Review of Systems Review of Systems  Constitutional:  Negative for activity change, appetite change and fever.  Skin:  Positive for color change and wound.  Neurological:  Negative for weakness and numbness.  Hematological: Negative.   Psychiatric/Behavioral: Negative.      Physical Exam Triage Vital Signs ED Triage Vitals  Enc Vitals Group     BP 02/28/21 1136 113/78  Pulse Rate 02/28/21 1136 (!) 53     Resp 02/28/21 1136 18     Temp 02/28/21 1136 98.1 F (36.7 C)     Temp Source 02/28/21 1136 Oral     SpO2 02/28/21 1136 99 %     Weight 02/28/21 1133 152 lb (68.9 kg)     Height 02/28/21 1133 5\' 9"  (1.753 m)     Head Circumference --      Peak Flow --      Pain Score 02/28/21 1133 2     Pain Loc --      Pain Edu? --      Excl. in [place]? --    No data found.  Updated Vital Signs BP 113/78 (BP Location: Left Arm)   Pulse (!) 53   Temp 98.1 F (36.7 C) (Oral)   Resp 18   Ht 5\' 9"  (1.753 m)   Wt 152 lb (68.9 kg)   SpO2 99%   BMI 22.45 kg/m   Visual Acuity Right Eye Distance:   Left Eye Distance:   Bilateral Distance:    Right Eye Near:   Left Eye Near:    Bilateral Near:     Physical Exam Vitals and nursing note reviewed.  Constitutional:      General: He is not in acute distress.    Appearance: Normal appearance. He is normal weight. He is not ill-appearing.  HENT:     Head: Normocephalic and atraumatic.  Musculoskeletal:        General: Signs of injury present.  Skin:    Capillary Refill: Capillary refill takes less than 2 seconds.     Findings: Erythema present. No bruising.  Neurological:     General: No  focal deficit present.     Mental Status: He is alert and oriented to person, place, and time.  Psychiatric:        Mood and Affect: Mood normal.        Thought Content: Thought content normal.        Judgment: Judgment normal.     UC Treatments / Results  Labs (all labs ordered are listed, but only abnormal results are displayed) Labs Reviewed - No data to display  EKG   Radiology No results found.  Procedures Laceration Repair  Date/Time: 02/28/2021 12:18 PM Performed by: Margarette Canada, NP Authorized by: Margarette Canada, NP   Consent:    Consent obtained:  Verbal   Consent given by:  Patient   Risks discussed:  Infection, poor cosmetic result and pain   Alternatives discussed:  No treatment Universal protocol:    Patient identity confirmed:  Verbally with patient Anesthesia:    Anesthesia method:  Local infiltration   Local anesthetic:  Lidocaine 1% WITH epi (0.5 mL) Laceration details:    Location:  Shoulder/arm   Shoulder/arm location:  R lower arm   Length (cm):  1.5   Depth (mm):  2 Pre-procedure details:    Preparation:  Patient was prepped and draped in usual sterile fashion Exploration:    Hemostasis achieved with:  Direct pressure   Wound extent: areolar tissue violated     Wound extent: no fascia violation noted, no foreign bodies/material noted, no muscle damage noted, no nerve damage noted, no tendon damage noted and no vascular damage noted     Contaminated: no   Treatment:    Area cleansed with:  Chlorhexidine   Debridement:  None   Undermining:  None Skin repair:    Repair method:  Sutures   Suture size:  6-0   Suture material:  Prolene   Suture technique:  Simple interrupted   Number of sutures:  2 Approximation:    Approximation:  Close Repair type:    Repair type:  Simple Post-procedure details:    Dressing:  Antibiotic ointment   Procedure completion:  Tolerated Comments:     1.5 dermal laceration to the volar aspect of the distal  right forearm anesthetized with 0.5 mL of 1% lidocaine with epinephrine.  Wound was cleansed with chlorhexidine and sterile saline, draped in a sterile fashion, and closed with 2 interrupted 6-0 Prolene sutures.  Wound was cleansed with chlorhexidine and saline a second time, dried with gauze, and bacitracin and a Band-Aid applied for dressing.  Patient tolerated procedure well. (including critical care time)  Medications Ordered in UC Medications  tetanus & diphtheria toxoids (adult) (TENIVAC) injection 0.5 mL (has no administration in time range)    Initial Impression / Assessment and Plan / UC Course  I have reviewed the triage vital signs and the nursing notes.  Pertinent labs & imaging results that were available during my care of the patient were reviewed by me and considered in my medical decision making (see chart for details).  Patient is a very pleasant 81 year old male here for evaluation of laceration to the volar aspect of the right forearm that he sustained when he was putting up a metal box fan for his horses.  He reports that he remove the shroud to provide better airflow and then the fan slipped and fell cutting his forearm on the way down.  Bleeding is controlled with direct pressure.  The wound is on the distal volar aspect of the right forearm and is 1.5 cm in length and does not penetrate below the dermis.  Will clean the wound with chlorhexidine and applied 2 sutures to pull together.  We will place patient on prophylactic antibiotic therapy with Keflex twice daily for 5 days and will update tetanus shot.   Final Clinical Impressions(s) / UC Diagnoses   Final diagnoses:  Laceration of right forearm, initial encounter     Discharge Instructions      Leave the dressing in place for the next 24 hours.  After that, you can remove the dressing and cleanse the wound with warm water and soap.  Pat the wound dry, apply a thin smear of bacitracin and a Band-Aid.  Only apply  bacitracin and Band-Aid for the next 2 days and then you can stop applying the ointment.  After that you can leave the wound open to the air while you are at home and cover when you are working.  Take the Keflex twice daily for 5 days to prevent infection.  Return in 10 days for suture removal.  Return sooner if you develop any redness at the site, pain at site, heat or drainage from site, or red streaks ascending your arm, or fever.     ED Prescriptions     Medication Sig Dispense Auth. Provider   cephALEXin (KEFLEX) 500 MG capsule Take 1 capsule (500 mg total) by mouth 2 (two) times daily for 5 days. 10 capsule Margarette Canada, NP      PDMP not reviewed this encounter.   Margarette Canada, NP 02/28/21 1222

## 2021-02-28 NOTE — Discharge Instructions (Signed)
Leave the dressing in place for the next 24 hours.  After that, you can remove the dressing and cleanse the wound with warm water and soap.  Pat the wound dry, apply a thin smear of bacitracin and a Band-Aid.  Only apply bacitracin and Band-Aid for the next 2 days and then you can stop applying the ointment.  After that you can leave the wound open to the air while you are at home and cover when you are working.  Take the Keflex twice daily for 5 days to prevent infection.  Return in 10 days for suture removal.  Return sooner if you develop any redness at the site, pain at site, heat or drainage from site, or red streaks ascending your arm, or fever.

## 2021-05-24 ENCOUNTER — Encounter: Payer: Self-pay | Admitting: General Surgery

## 2021-05-25 ENCOUNTER — Encounter: Payer: Self-pay | Admitting: General Surgery

## 2021-05-25 ENCOUNTER — Encounter
Admission: RE | Disposition: A | Discharge status: Home / Self Care | Payer: Self-pay | Source: Home / Self Care | Attending: General Surgery

## 2021-05-25 ENCOUNTER — Ambulatory Visit
Admission: RE | Admit: 2021-05-25 | Discharge: 2021-05-25 | Disposition: A | Discharge status: Home / Self Care | Payer: Medicare Other | Attending: General Surgery | Admitting: General Surgery

## 2021-05-25 ENCOUNTER — Other Ambulatory Visit: Payer: Self-pay

## 2021-05-25 ENCOUNTER — Ambulatory Visit: Payer: Medicare Other | Admitting: Anesthesiology

## 2021-05-25 DIAGNOSIS — K3189 Other diseases of stomach and duodenum: Secondary | ICD-10-CM | POA: Insufficient documentation

## 2021-05-25 DIAGNOSIS — Z1211 Encounter for screening for malignant neoplasm of colon: Secondary | ICD-10-CM | POA: Insufficient documentation

## 2021-05-25 DIAGNOSIS — Z79899 Other long term (current) drug therapy: Secondary | ICD-10-CM | POA: Insufficient documentation

## 2021-05-25 DIAGNOSIS — Z86010 Personal history of colonic polyps: Secondary | ICD-10-CM | POA: Insufficient documentation

## 2021-05-25 DIAGNOSIS — K449 Diaphragmatic hernia without obstruction or gangrene: Secondary | ICD-10-CM | POA: Insufficient documentation

## 2021-05-25 DIAGNOSIS — K2100 Gastro-esophageal reflux disease with esophagitis, without bleeding: Secondary | ICD-10-CM | POA: Diagnosis not present

## 2021-05-25 DIAGNOSIS — Z8501 Personal history of malignant neoplasm of esophagus: Secondary | ICD-10-CM | POA: Diagnosis present

## 2021-05-25 DIAGNOSIS — K317 Polyp of stomach and duodenum: Secondary | ICD-10-CM | POA: Diagnosis not present

## 2021-05-25 DIAGNOSIS — Z87891 Personal history of nicotine dependence: Secondary | ICD-10-CM | POA: Diagnosis not present

## 2021-05-25 HISTORY — PX: COLONOSCOPY WITH PROPOFOL: SHX5780

## 2021-05-25 HISTORY — PX: ESOPHAGOGASTRODUODENOSCOPY (EGD) WITH PROPOFOL: SHX5813

## 2021-05-25 SURGERY — COLONOSCOPY WITH PROPOFOL
Anesthesia: General

## 2021-05-25 MED ORDER — SODIUM CHLORIDE 0.9 % IV SOLN: INTRAVENOUS | Status: DC

## 2021-05-25 MED ORDER — PROPOFOL 500 MG/50ML IV EMUL
INTRAVENOUS | Status: DC | PRN
  Administered 2021-05-25: 120 ug/kg/min via INTRAVENOUS

## 2021-05-25 MED ORDER — APIXABAN 5 MG PO TABS: 5.0000 mg | ORAL_TABLET | Freq: Two times a day (BID) | ORAL | Status: AC

## 2021-05-25 MED ORDER — LIDOCAINE HCL (CARDIAC) PF 100 MG/5ML IV SOSY
PREFILLED_SYRINGE | INTRAVENOUS | Status: DC | PRN
  Administered 2021-05-25: 50 mg via INTRAVENOUS

## 2021-05-25 MED ORDER — PROPOFOL 500 MG/50ML IV EMUL
INTRAVENOUS | Status: AC
  Filled 2021-05-25: qty 50

## 2021-05-25 MED ORDER — LIDOCAINE HCL (PF) 2 % IJ SOLN
INTRAMUSCULAR | Status: AC
  Filled 2021-05-25: qty 5

## 2021-05-25 NOTE — Op Note (Addendum)
[facility] Gastroenterology Patient Name: Blake Jenkins Procedure Date: 05/25/2021 10:10 AM MRN: [id number] Account #: [id number] Date of Birth: September 02, 1941 Admit Type: Outpatient Age: 80 Room: [facility] ENDO ROOM 1 Gender: Male Note Status: Supervisor Override Instrument Name: Upper Endoscope [id number] Procedure:             Upper GI endoscopy Indications:           Personal history of malignant esophageal neoplasm Providers:             Robert Bellow, MD Referring MD:          Gladstone Lighter, MD (Referring MD) Medicines:             Propofol per Anesthesia Complications:         No immediate complications. Procedure:             Pre-Anesthesia Assessment:                        - Prior to the procedure, a History and Physical was                         performed, and patient medications, allergies and                         sensitivities were reviewed. The patient's tolerance                         of previous anesthesia was reviewed.                        - The risks and benefits of the procedure and the                         sedation options and risks were discussed with the                         patient. All questions were answered and informed                         consent was obtained.                        After obtaining informed consent, the endoscope was                         passed under direct vision. Throughout the procedure,                         the patient's blood pressure, pulse, and oxygen                         saturations were monitored continuously. The Endoscope                         was introduced through the mouth, and advanced to the                         third part of duodenum. The upper GI endoscopy was  accomplished without difficulty. The patient tolerated                         the procedure well. Findings:      LA Grade D (one or more mucosal breaks involving at least 75% of       esophageal  circumference) esophagitis with no bleeding was found 30 to       31 cm from the incisors. Biopsies were taken with a cold forceps for       histology. GEJ at 27 cm.      A medium-sized hiatal hernia was present.      A single 8 mm sessile polyp with no bleeding and no stigmata of recent       bleeding was found in the cardia. Biopsies were taken with a cold       forceps for histology.      Diffuse nodular mucosa was found in the duodenal bulb. Biopsies were       taken with a cold forceps for histology. Impression:            - LA Grade D reflux esophagitis with no bleeding. Rule                         out Barrett's esophagus. Biopsied.                        - Medium-sized hiatal hernia.                        - A single gastric polyp. Biopsied.                        - Nodular mucosa in the duodenal bulb. Biopsied. Recommendation:        - Telephone endoscopist for pathology results in 1                         week. Procedure Code(s):     --- Professional ---                        [phone], Esophagogastroduodenoscopy, flexible,                         transoral; with biopsy, single or multiple Diagnosis Code(s):     --- Professional ---                        K21.00, Gastro-esophageal reflux disease with                         esophagitis, without bleeding                        K44.9, Diaphragmatic hernia without obstruction or                         gangrene                        K31.7, Polyp of stomach and duodenum                        K31.89, Other  diseases of stomach and duodenum                        K22.70, Barrett's esophagus without dysplasia CPT copyright 2019 American Medical Association. All rights reserved. The codes documented in this report are preliminary and upon coder review may  be revised to meet current compliance requirements. Robert Bellow, MD 05/25/2021 10:33:41 AM This report has been signed electronically. Number of Addenda: 0 Note Initiated  On: 05/25/2021 10:10 AM Estimated Blood Loss:  Estimated blood loss was minimal.      [facility]

## 2021-05-25 NOTE — Anesthesia Postprocedure Evaluation (Signed)
Anesthesia Post Note  Patient: Blake Jenkins  Procedure(s) Performed: COLONOSCOPY WITH PROPOFOL ESOPHAGOGASTRODUODENOSCOPY (EGD) WITH PROPOFOL  Patient location during evaluation: Endoscopy Anesthesia Type: General Level of consciousness: awake and alert Pain management: pain level controlled Vital Signs Assessment: post-procedure vital signs reviewed and stable Respiratory status: spontaneous breathing, nonlabored ventilation, respiratory function stable and patient connected to nasal cannula oxygen Cardiovascular status: blood pressure returned to baseline and stable Postop Assessment: no apparent nausea or vomiting Anesthetic complications: no   No notable events documented.   Last Vitals:  Vitals:   05/25/21 1115 05/25/21 1125  BP: 97/67 100/70  Pulse: (!) 52 (!) 48  Resp: 18 14  Temp:    SpO2: 99% 100%    Last Pain:  Vitals:   05/25/21 1125  TempSrc:   PainSc: 0-No pain                 Precious Haws [place]one

## 2021-05-25 NOTE — Anesthesia Preprocedure Evaluation (Signed)
Anesthesia Evaluation  Patient identified by MRN, date of birth, ID band Patient awake    Reviewed: Allergy & Precautions, NPO status , Patient's Chart, lab work & pertinent test results  History of Anesthesia Complications Negative for: history of anesthetic complications  Airway Mallampati: III  TM Distance: >3 FB Neck ROM: limited    Dental  (+) Chipped, Missing   Pulmonary neg pulmonary ROS, neg shortness of breath, former smoker,    Pulmonary exam normal        Cardiovascular Exercise Tolerance: Good hypertension, (-) anginaNormal cardiovascular exam+ dysrhythmias      Neuro/Psych PSYCHIATRIC DISORDERS CVA negative psych ROS   GI/Hepatic Neg liver ROS, GERD  Medicated and Controlled,  Endo/Other  negative endocrine ROS  Renal/GU Renal disease  negative genitourinary   Musculoskeletal  (+) Arthritis ,   Abdominal   Peds  Hematology negative hematology ROS (+)   Anesthesia Other Findings Past Medical History: No date: A-fib (HCC) No date: Arthritis     Comment:  RIGHT KNEE No date: Barrett's esophagus with dysplasia     Comment:  resection 2011 No date: BPH (benign prostatic hyperplasia) No date: Cancer [facility])     Comment:  January 2001 Esophageal Cancer in situ No date: Chronic kidney disease     Comment:  stage 3 No date: Colon polyps No date: Depression No date: Dysrhythmia     Comment:  A-FIB,TRANSIENT No date: Elevated PSA 01/04/2018: Genetic testing     Comment:  Multi-Cancer panel (83 genes) @ [facility] - No pathogenic               mutations detected No date: GERD (gastroesophageal reflux disease) No date: History of esophageal cancer No date: Hypercholesteremia No date: Hypertension No date: Hypertrophy of prostate without urinary obstruction No date: Knee pain, bilateral No date: Shoulder pain, left 2010: Stroke ([place])     Comment:  optic   Past Surgical History: No date:  ADENOIDECTOMY 2015: CATARACT EXTRACTION 01/30/2017: COLONOSCOPY WITH PROPOFOL; N/A     Comment:  Procedure: COLONOSCOPY WITH PROPOFOL;  Surgeon:               Lollie Sails, MD;  Location: [facility] ENDOSCOPY;                Service: Endoscopy;  Laterality: N/A; 12/20/2017: COLONOSCOPY WITH PROPOFOL; N/A     Comment:  Procedure: COLONOSCOPY WITH PROPOFOL;  Surgeon:               Lollie Sails, MD;  Location: [facility] ENDOSCOPY;                Service: Endoscopy;  Laterality: N/A; 04/30/2018: CYSTOSCOPY W/ RETROGRADES; Bilateral     Comment:  Procedure: CYSTOSCOPY WITH RETROGRADE PYELOGRAM;                Surgeon: Abbie Sons, MD;  Location: [facility] ORS;                Service: Urology;  Laterality: Bilateral; No date: ESOPHAGEAL RESECTION 03/04/2018: ESOPHAGOGASTRODUODENOSCOPY (EGD) WITH PROPOFOL; N/A     Comment:  Procedure: ESOPHAGOGASTRODUODENOSCOPY (EGD) WITH               PROPOFOL;  Surgeon: Lollie Sails, MD;  Location:               [facility] ENDOSCOPY;  Service: Endoscopy;  Laterality: N/A; 04/04/2018: ESOPHAGOGASTRODUODENOSCOPY (EGD) WITH PROPOFOL; N/A     Comment:  Procedure: ESOPHAGOGASTRODUODENOSCOPY (EGD) WITH  PROPOFOL;  Surgeon: Lollie Sails, MD;  Location:               [facility] ENDOSCOPY;  Service: Endoscopy;  Laterality: N/A; No date: KNEE ARTHROSCOPY; Left No date: PROSTATE SURGERY No date: TONSILLECTOMY     Comment:  age 80 04/30/2018: TRANSURETHRAL RESECTION OF BLADDER TUMOR; N/A     Comment:  Procedure: TRANSURETHRAL RESECTION OF BLADDER TUMOR               (TURBT);  Surgeon: Abbie Sons, MD;  Location: [facility]               ORS;  Service: Urology;  Laterality: N/A; 06/22/2020: TRANSURETHRAL RESECTION OF PROSTATE; N/A     Comment:  Procedure: TRANSURETHRAL RESECTION OF THE PROSTATE               (TURP);  Surgeon: Abbie Sons, MD;  Location: [facility]               ORS;  Service: Urology;  Laterality: N/A; 05/10/2011: TURP VAPORIZATION  BMI     Body Mass Index: 22.15 kg/m      Reproductive/Obstetrics negative OB ROS                             Anesthesia Physical Anesthesia Plan  ASA: 3  Anesthesia Plan: General   Post-op Pain Management:    Induction: Intravenous  PONV Risk Score and Plan: Propofol infusion and TIVA  Airway Management Planned: Natural Airway and Nasal Cannula  Additional Equipment:   Intra-op Plan:   Post-operative Plan:   Informed Consent: I have reviewed the patients History and Physical, chart, labs and discussed the procedure including the risks, benefits and alternatives for the proposed anesthesia with the patient or authorized representative who has indicated his/her understanding and acceptance.     Dental Advisory Given  Plan Discussed with: Anesthesiologist, CRNA and Surgeon  Anesthesia Plan Comments: (Patient consented for risks of anesthesia including but not limited to:  - adverse reactions to medications - risk of airway placement if required - damage to eyes, teeth, lips or other oral mucosa - nerve damage due to positioning  - sore throat or hoarseness - Damage to heart, brain, nerves, lungs, other parts of body or loss of life  Patient voiced understanding.)        Anesthesia Quick Evaluation

## 2021-05-25 NOTE — Op Note (Signed)
[facility] Gastroenterology Patient Name: Blake Jenkins Procedure Date: 05/25/2021 10:09 AM MRN: [id number] Account #: [id number] Date of Birth: [birth year] Admit Type: Outpatient Age: 80 Room: [facility] ENDO ROOM 1 Gender: Male Note Status: Finalized Instrument Name: Peds Colonoscope [id number] Procedure:             Colonoscopy Indications:           High risk colon cancer surveillance: Personal history                         of colonic polyps Providers:             Robert Bellow, MD Referring MD:          Gladstone Lighter, MD (Referring MD) Medicines:             Propofol per Anesthesia Complications:         No immediate complications. Procedure:             Pre-Anesthesia Assessment:                        - Prior to the procedure, a History and Physical was                         performed, and patient medications, allergies and                         sensitivities were reviewed. The patient's tolerance                         of previous anesthesia was reviewed.                        - The risks and benefits of the procedure and the                         sedation options and risks were discussed with the                         patient. All questions were answered and informed                         consent was obtained.                        After obtaining informed consent, the colonoscope was                         passed under direct vision. Throughout the procedure,                         the patient's blood pressure, pulse, and oxygen                         saturations were monitored continuously. The                         Colonoscope was introduced through the anus and                         advanced  to the the terminal ileum. The colonoscopy                         was somewhat difficult due to a tortuous colon.                         Successful completion of the procedure was aided by                         applying abdominal pressure. The  patient tolerated the                         procedure well. The quality of the bowel preparation                         was excellent. Findings:      The entire examined colon appeared normal on direct and retroflexion       views. Impression:            - The entire examined colon is normal on direct and                         retroflexion views.                        - No specimens collected. Recommendation:        - Repeat colonoscopy in 5 years for surveillance. Procedure Code(s):     --- Professional ---                        [phone], Colonoscopy, flexible; diagnostic, including                         collection of specimen(s) by brushing or washing, when                         performed (separate procedure) Diagnosis Code(s):     --- Professional ---                        Z86.010, Personal history of colonic polyps CPT copyright 2019 American Medical Association. All rights reserved. The codes documented in this report are preliminary and upon coder review may  be revised to meet current compliance requirements. Robert Bellow, MD 05/25/2021 10:51:55 AM This report has been signed electronically. Number of Addenda: 0 Note Initiated On: 05/25/2021 10:09 AM Scope Withdrawal Time: 0 hours 8 minutes 16 seconds  Total Procedure Duration: 0 hours 14 minutes 32 seconds  Estimated Blood Loss:  Estimated blood loss: none.      [facility]

## 2021-05-25 NOTE — H&P (Signed)
CYRUS KOLPACK [id number] 1940-11-05     HPI:  80 y/o male 20 years s/p esophagectomy for carcinoma [facility]). Past history colon polyps.  Tolerated prep well. Off Eliquis x 72 hours.   Medications Prior to Admission  Medication Sig Dispense Refill Last Dose   atorvastatin (LIPITOR) 20 MG tablet Take by mouth.   05/24/2021 at 2100   calcium carbonate (TUMS - DOSED IN MG ELEMENTAL CALCIUM) 500 MG chewable tablet Chew 1 tablet by mouth 3 (three) times daily as needed for indigestion or heartburn.    05/24/2021 at 2100   Cholecalciferol (VITAMIN D) 2000 units tablet Take 4,000 Units by mouth daily.    05/24/2021   metoprolol succinate (TOPROL-XL) 25 MG 24 hr tablet Take 25 mg by mouth daily at 12 noon.    05/25/2021 at 0600   pantoprazole (PROTONIX) 40 MG tablet Take 40 mg by mouth 2 (two) times daily.   05/25/2021 at 0600   sertraline (ZOLOFT) 100 MG tablet Take 100 mg by mouth every evening.    05/23/2021   apixaban (ELIQUIS) 5 MG TABS tablet Take 5 mg by mouth 2 (two) times daily.   05/21/2021 at 0700   lisinopril (ZESTRIL) 5 MG tablet Take 5 mg by mouth daily.   05/23/2021   sucralfate (CARAFATE) 1 g tablet Take 1 g by mouth 3 (three) times daily as needed (indigestion/heartburn.).  (Patient not taking: Reported on 05/25/2021)   Not Taking   No Known Allergies Past Medical History:  Diagnosis Date   A-fib [facility])    Arthritis    RIGHT KNEE   Barrett's esophagus with dysplasia    resection 2011   BPH (benign prostatic hyperplasia)    Cancer 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    Past Surgical History:  Procedure Laterality Date   ADENOIDECTOMY     CATARACT EXTRACTION  2015   COLONOSCOPY WITH PROPOFOL N/A 01/30/2017   Procedure: COLONOSCOPY WITH PROPOFOL;  Surgeon: Lollie Sails, MD;  Location: [facility] ENDOSCOPY;  Service: Endoscopy;  Laterality: N/A;   COLONOSCOPY WITH PROPOFOL N/A 12/20/2017   Procedure: COLONOSCOPY WITH PROPOFOL;  Surgeon: Lollie Sails, MD;  Location: [facility] ENDOSCOPY;  Service: Endoscopy;  Laterality: N/A;   CYSTOSCOPY W/ RETROGRADES Bilateral 04/30/2018   Procedure: CYSTOSCOPY WITH RETROGRADE PYELOGRAM;  Surgeon: Abbie Sons, MD;  Location: [facility] ORS;  Service: Urology;  Laterality: Bilateral;   ESOPHAGEAL RESECTION     ESOPHAGOGASTRODUODENOSCOPY (EGD) WITH PROPOFOL N/A 03/04/2018   Procedure: ESOPHAGOGASTRODUODENOSCOPY (EGD) WITH PROPOFOL;  Surgeon: Lollie Sails, MD;  Location: [facility] ENDOSCOPY;  Service: Endoscopy;  Laterality: N/A;   ESOPHAGOGASTRODUODENOSCOPY (EGD) WITH PROPOFOL N/A 04/04/2018   Procedure: ESOPHAGOGASTRODUODENOSCOPY (EGD) WITH PROPOFOL;  Surgeon: Lollie Sails, MD;  Location: [facility] ENDOSCOPY;  Service: Endoscopy;  Laterality: N/A;   KNEE ARTHROSCOPY Left    PROSTATE SURGERY     TONSILLECTOMY  age 65   TRANSURETHRAL RESECTION OF BLADDER TUMOR N/A 04/30/2018   Procedure: TRANSURETHRAL RESECTION OF BLADDER TUMOR (TURBT);  Surgeon: Abbie Sons, MD;  Location: [facility] ORS;  Service: Urology;  Laterality: N/A;   TRANSURETHRAL RESECTION OF PROSTATE N/A 06/22/2020   Procedure: TRANSURETHRAL RESECTION OF THE PROSTATE (TURP);  Surgeon: Abbie Sons, MD;  Location: [facility] ORS;  Service: Urology;  Laterality: N/A;   TURP VAPORIZATION  05/10/2011   Social History   Socioeconomic History   Marital status: Widowed    Spouse name: Not on file   Number of children: Not on file   Years of education: Not on file   Highest education level: Not on file   Occupational History   Not on file  Tobacco Use   Smoking status: Former    Packs/day: 1.00    Years: 2.00    Pack years: 2.00    Types: Cigarettes    Quit date: 04/15/1972    Years since quitting: 49.1   Smokeless tobacco: Never  Vaping Use   Vaping Use: Never used  Substance and Sexual Activity   Alcohol use: Yes    Comment: wine weekly    Drug use: Never   Sexual activity: Not Currently  Other Topics Concern   Not on file  Social History Narrative   Not on file   Social Determinants of Health   Financial Resource Strain: Not on file  Food Insecurity: Not on file  Transportation Needs: Not on file  Physical Activity: Not on file  Stress: Not on file  Social Connections: Not on file  Intimate Partner Violence: Not on file   Social History   Social History Narrative   Not on file  December 20, 2017 pathology:  DIAGNOSIS:  A.  COLON POLYPS 2, SIGMOID; COLD BIOPSY:  - PROMINENT LYMPHOID AGGREGATE (1).  - FIBROSIS OF THE LAMINA PROPRIA WITH FEATURES OF A FIBROBLASTIC POLYP  (1).   B.  COLON POLYP, PROXIMAL TRANSVERSE; HOT SNARE:  - TUBULAR ADENOMA.  - NEGATIVE FOR HIGH-GRADE DYSPLASIA AND MALIGNANCY.   C.  COLON POLYP, MID TRANSVERSE; HOT SNARE:  - TUBULAR ADENOMA.  - NEGATIVE FOR HIGH-GRADE DYSPLASIA AND MALIGNANCY.   D.  COLON POLYP, CECUM; COLD BIOPSY:  - TUBULAR ADENOMA.  - NEGATIVE FOR HIGH-GRADE DYSPLASIA AND MALIGNANCY.   E.  COLON POLYP, PROXIMAL ASCENDING; COLD BIOPSY:  - SMALL LYMPHOID AGGREGATE (1).   F.  COLON POLYP, RECTOSIGMOID JUNCTION; COLD BIOPSY:  - HYPERPLASTIC POLYP.  - NEGATIVE FOR DYSPLASIA AND MALIGNANCY.   July 18. 2019 pathology:  DIAGNOSIS:  A.  STOMACH, ANTRUM; COLD BIOPSY:  - ANTRAL MUCOSA WITH MINIMAL CHRONIC GASTRITIS.  - NEGATIVE FOR H. PYLORI, DYSPLASIA AND MALIGNANCY.   B.  STOMACH, BODY; COLD BIOPSY:  - OXYNTIC MUCOSA WITH FOCAL MINIMAL CHRONIC GASTRITIS.  - NEGATIVE FOR H. PYLORI, DYSPLASIA, AND MALIGNANCY.   C.   ESOPHAGEAL ANASTOMOSIS, 30-28 CM; COLD BIOPSY:  - REFLUX CHANGE.  - NEGATIVE FOR GOBLET CELLS, DYSPLASIA AND MALIGNANCY  ROS: Negative.     PE: HEENT: Negative. Lungs: Clear. Cardio: RR. AF not detected today).    Assessment/Plan:  Proceed with planned endoscopy.   Forest Gleason [facility] 05/25/2021

## 2021-05-25 NOTE — Transfer of Care (Signed)
Immediate Anesthesia Transfer of Care Note  Patient: Blake Jenkins  Procedure(s) Performed: COLONOSCOPY WITH PROPOFOL ESOPHAGOGASTRODUODENOSCOPY (EGD) WITH PROPOFOL  Patient Location: PACU  Anesthesia Type:General  Level of Consciousness: awake and sedated  Airway & Oxygen Therapy: Patient Spontanous Breathing and Patient connected to nasal cannula oxygen  Post-op Assessment: Report given to RN and Post -op Vital signs reviewed and stable  Post vital signs: Reviewed and stable  Last Vitals:  Vitals Value Taken Time  BP    Temp    Pulse    Resp    SpO2      Last Pain:  Vitals:   05/25/21 0946  TempSrc: Temporal  PainSc: 0-No pain         Complications: No notable events documented.

## 2021-05-25 NOTE — Anesthesia Procedure Notes (Signed)
Date/Time: 05/25/2021 10:24 AM Performed by: Vaughan Sine Pre-anesthesia Checklist: Patient identified, Emergency Drugs available, Suction available, Patient being monitored and Timeout performed Patient Re-evaluated:Patient Re-evaluated prior to induction Oxygen Delivery Method: Nasal cannula Preoxygenation: Pre-oxygenation with 100% oxygen Induction Type: IV induction Airway Equipment and Method: Bite block Placement Confirmation: positive ETCO2 and CO2 detector

## 2021-05-26 ENCOUNTER — Encounter: Payer: Self-pay | Admitting: General Surgery

## 2021-05-26 LAB — SURGICAL PATHOLOGY

## 2021-06-06 ENCOUNTER — Other Ambulatory Visit: Payer: Self-pay | Admitting: Internal Medicine

## 2021-06-06 DIAGNOSIS — G44011 Episodic cluster headache, intractable: Secondary | ICD-10-CM

## 2021-06-07 ENCOUNTER — Ambulatory Visit
Admission: RE | Admit: 2021-06-07 | Discharge: 2021-06-07 | Disposition: A | Discharge status: Home / Self Care | Payer: Medicare Other | Source: Ambulatory Visit | Attending: Internal Medicine | Admitting: Internal Medicine

## 2021-06-07 ENCOUNTER — Other Ambulatory Visit: Payer: Self-pay

## 2021-06-07 DIAGNOSIS — G44011 Episodic cluster headache, intractable: Secondary | ICD-10-CM | POA: Diagnosis present

## 2021-06-30 ENCOUNTER — Ambulatory Visit: Payer: Medicare Other | Admitting: Urology

## 2021-07-06 ENCOUNTER — Ambulatory Visit: Payer: Medicare Other | Admitting: Urology

## 2021-11-05 ENCOUNTER — Emergency Department (HOSPITAL_COMMUNITY)
Admission: EM | Admit: 2021-11-05 | Discharge: 2021-11-06 | Disposition: A | Discharge status: Home / Self Care | Payer: Medicare Other | Attending: Emergency Medicine | Admitting: Emergency Medicine

## 2021-11-05 ENCOUNTER — Emergency Department (HOSPITAL_COMMUNITY): Payer: Medicare Other

## 2021-11-05 ENCOUNTER — Encounter (HOSPITAL_COMMUNITY): Payer: Self-pay

## 2021-11-05 ENCOUNTER — Other Ambulatory Visit: Payer: Self-pay

## 2021-11-05 DIAGNOSIS — Z8501 Personal history of malignant neoplasm of esophagus: Secondary | ICD-10-CM | POA: Diagnosis not present

## 2021-11-05 DIAGNOSIS — R791 Abnormal coagulation profile: Secondary | ICD-10-CM | POA: Diagnosis not present

## 2021-11-05 DIAGNOSIS — N183 Chronic kidney disease, stage 3 unspecified: Secondary | ICD-10-CM | POA: Insufficient documentation

## 2021-11-05 DIAGNOSIS — R519 Headache, unspecified: Secondary | ICD-10-CM | POA: Diagnosis present

## 2021-11-05 DIAGNOSIS — N3000 Acute cystitis without hematuria: Secondary | ICD-10-CM | POA: Diagnosis not present

## 2021-11-05 DIAGNOSIS — I129 Hypertensive chronic kidney disease with stage 1 through stage 4 chronic kidney disease, or unspecified chronic kidney disease: Secondary | ICD-10-CM | POA: Insufficient documentation

## 2021-11-05 DIAGNOSIS — Z8679 Personal history of other diseases of the circulatory system: Secondary | ICD-10-CM

## 2021-11-05 LAB — CBC WITH DIFFERENTIAL/PLATELET
Abs Immature Granulocytes: 0.06 10*3/uL (ref 0.00–0.07)
Basophils Absolute: 0 10*3/uL (ref 0.0–0.1)
Basophils Relative: 0 %
Eosinophils Absolute: 0 10*3/uL (ref 0.0–0.5)
Eosinophils Relative: 0 %
HCT: 36 % — ABNORMAL LOW (ref 39.0–52.0)
Hemoglobin: 10.6 g/dL — ABNORMAL LOW (ref 13.0–17.0)
Immature Granulocytes: 1 %
Lymphocytes Relative: 8 %
Lymphs Abs: 0.8 10*3/uL (ref 0.7–4.0)
MCH: 21.4 pg — ABNORMAL LOW (ref 26.0–34.0)
MCHC: 29.4 g/dL — ABNORMAL LOW (ref 30.0–36.0)
MCV: 72.7 fL — ABNORMAL LOW (ref 80.0–100.0)
Monocytes Absolute: 0.4 10*3/uL (ref 0.1–1.0)
Monocytes Relative: 4 %
Neutro Abs: 8.9 10*3/uL — ABNORMAL HIGH (ref 1.7–7.7)
Neutrophils Relative %: 87 %
Platelets: 267 10*3/uL (ref 150–400)
RBC: 4.95 MIL/uL (ref 4.22–5.81)
RDW: 18.7 % — ABNORMAL HIGH (ref 11.5–15.5)
WBC: 10.2 10*3/uL (ref 4.0–10.5)
nRBC: 0 % (ref 0.0–0.2)

## 2021-11-05 LAB — COMPREHENSIVE METABOLIC PANEL
ALT: 26 U/L (ref 0–44)
AST: 30 U/L (ref 15–41)
Albumin: 3.7 g/dL (ref 3.5–5.0)
Alkaline Phosphatase: 63 U/L (ref 38–126)
Anion gap: 9 (ref 5–15)
BUN: 18 mg/dL (ref 8–23)
CO2: 22 mmol/L (ref 22–32)
Calcium: 8.6 mg/dL — ABNORMAL LOW (ref 8.9–10.3)
Chloride: 103 mmol/L (ref 98–111)
Creatinine, Ser: 1.37 mg/dL — ABNORMAL HIGH (ref 0.61–1.24)
GFR, Estimated: 52 mL/min — ABNORMAL LOW (ref >60)
Glucose, Bld: 151 mg/dL — ABNORMAL HIGH (ref 70–99)
Potassium: 3.7 mmol/L (ref 3.5–5.1)
Sodium: 134 mmol/L — ABNORMAL LOW (ref 135–145)
Total Bilirubin: 0.6 mg/dL (ref 0.3–1.2)
Total Protein: 6.3 g/dL — ABNORMAL LOW (ref 6.5–8.1)

## 2021-11-05 LAB — TROPONIN I (HIGH SENSITIVITY): Troponin I (High Sensitivity): 11 ng/L (ref <18)

## 2021-11-05 LAB — LACTIC ACID, PLASMA: Lactic Acid, Venous: 1.7 mmol/L (ref 0.5–1.9)

## 2021-11-05 LAB — PROTIME-INR
INR: 1.3 — ABNORMAL HIGH (ref 0.8–1.2)
Prothrombin Time: 16.5 seconds — ABNORMAL HIGH (ref 11.4–15.2)

## 2021-11-05 MED ORDER — IOHEXOL 350 MG/ML SOLN
100.0000 mL | Freq: Once | INTRAVENOUS | Status: AC | PRN
  Administered 2021-11-05: 100 mL via INTRAVENOUS

## 2021-11-05 MED ORDER — LABETALOL HCL 5 MG/ML IV SOLN: 10.0000 mg | Freq: Once | INTRAVENOUS | Status: DC

## 2021-11-05 MED ORDER — HYDRALAZINE HCL 20 MG/ML IJ SOLN
10.0000 mg | Freq: Once | INTRAMUSCULAR | Status: AC
  Administered 2021-11-05: 10 mg via INTRAVENOUS
  Filled 2021-11-05: qty 1

## 2021-11-05 NOTE — ED Triage Notes (Signed)
Arrives EMS from home with c/o HTN 212/90 nausea, vomiting, and left sided headache since 1930. 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   Metoprolol and Eliquis taken pta around 1940 tonight.

## 2021-11-05 NOTE — ED Provider Notes (Signed)
Emergency Department Provider Note   I have reviewed the triage vital signs and the nursing notes.   HISTORY  Chief Complaint Headache   HPI Blake Jenkins is a 81 y.o. male with PMH reviewed including PAF on Eliquis and segmental arterial medial lysis presents to the ED with nausea, HA, and abdominal pain. Patient tells me that he took a trip out of town by car to see a Programmer, multimedia.  He states on the way home he ate some chili but did not feel especially hungry.  He developed some nausea with mild regurgitation and abdominal pain 5/10 at the time but has since resolved.  He developed a left-sided headache, currently 7/10, without vision change or weakness/numbness.  No fevers or chills.  He returned home and found his blood pressure to be significantly elevated and given his history of SAM became concerned as these symptoms reminded him of his prior episode which ultimately required treatment at [facility] which involved stenting of the left carotid and lead to discovery of several small dissections in the abdomen. He is compliant with his home medications. He took Eliquis last this evening and his daughter in law gave an additional Metoprolol dose en route due to high BP.    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 ENT: No sore throat. Cardiovascular: Denies chest pain. Respiratory: Denies shortness of breath. Gastrointestinal: Positive abdominal pain. Positive nausea, no vomiting.  No diarrhea.  No constipation. Genitourinary: Negative for dysuria. Musculoskeletal: Negative for back pain. Skin: Negative for rash. Neurological: Negative for focal weakness or numbness. Positive HA.    ____________________________________________   PHYSICAL EXAM:  VITAL SIGNS: ED Triage Vitals  Enc Vitals Group     BP 11/05/21 2030 (!) 183/109     Pulse Rate 11/05/21 2030 62     Resp --      Temp 11/05/21 2035 (!) 97.5 F (36.4 C)     Temp src --      SpO2 11/05/21 2023 98 %     Weight 11/05/21 2024 160 lb (72.6 kg)     Height 11/05/21 2024 5\' 9"  (1.753 m)    Constitutional: Alert and oriented. Well appearing and in no acute distress. Eyes: Conjunctivae are normal.  Head: Atraumatic. Nose: No congestion/rhinnorhea. Mouth/Throat: Mucous membranes are moist.  Oropharynx non-erythematous. Neck: No stridor. No carotid bruits.  Cardiovascular: Normal rate, regular rhythm. Good peripheral circulation. Grossly normal heart sounds.   Respiratory: Normal respiratory effort.  No retractions. Lungs CTAB. Gastrointestinal: Soft and nontender. No distention.  Musculoskeletal: No lower extremity tenderness nor  edema. No gross deformities of extremities. Neurologic:  Normal speech and language. No gross focal neurologic deficits are appreciated.  Skin:  Skin is warm, dry and intact. No rash noted.  ____________________________________________   LABS (all labs ordered are listed, but only abnormal results are displayed)  Labs Reviewed  URINE CULTURE - Abnormal; Notable for the following components:      Result Value   Culture   (*)    Value: >=100,000 COLONIES/mL STAPHYLOCOCCUS EPIDERMIDIS SUSCEPTIBILITIES TO FOLLOW Performed at [facility], [place] [address], [place], [place] [address]    All other components within normal limits  COMPREHENSIVE METABOLIC PANEL - Abnormal; Notable for the following components:   Sodium 134 (*)    Glucose, Bld 151 (*)    Creatinine, Ser 1.37 (*)    Calcium 8.6 (*)    Total Protein 6.3 (*)    GFR, Estimated 52 (*)    All other components within normal limits  CBC WITH DIFFERENTIAL/PLATELET - Abnormal; Notable for the following components:   Hemoglobin 10.6 (*)    HCT 36.0 (*)    MCV 72.7 (*)    MCH 21.4 (*)    MCHC 29.4 (*)    RDW 18.7 (*)    Neutro Abs 8.9 (*)    All other components within normal limits  PROTIME-INR - Abnormal; Notable for the following components:   Prothrombin Time 16.5 (*)    INR 1.3 (*)    All other components within normal limits  URINALYSIS, ROUTINE W REFLEX MICROSCOPIC - Abnormal; Notable for the following components:   APPearance HAZY (*)    Glucose, UA 50 (*)    Hgb urine dipstick SMALL (*)    Protein, ur 30 (*)    Leukocytes,Ua MODERATE (*)    WBC, UA >50 (*)    Bacteria, UA RARE (*)    All other components within normal limits  TROPONIN I (HIGH SENSITIVITY) - Abnormal; Notable for the following components:   Troponin I (High Sensitivity) 18 (*)    All other components within normal limits  LACTIC ACID, PLASMA  TROPONIN I (HIGH SENSITIVITY)   ____________________________________________  EKG   EKG Interpretation  Date/Time:  Saturday November 05 2021 20:32:52 EST Ventricular Rate:  57 PR Interval:  154 QRS Duration: 86 QT Interval:  459 QTC Calculation: 447 R Axis:   21 Text Interpretation: Sinus rhythm Atrial premature complex Minimal ST depression, lateral leads Confirmed by Nanda Quinton [phone]) on 11/05/2021 8:40:01 PM        ____________________________________________   PROCEDURES  Procedure(s) performed:   Procedures  None ____________________________________________   INITIAL IMPRESSION / ASSESSMENT AND PLAN /  ED COURSE  Pertinent labs & imaging results that were available during my care of the patient were reviewed by me and considered in my medical decision making (see chart for details).   This patient is Presenting for Evaluation of HA, which does require a range of treatment options, and is a complaint that involves a high risk of morbidity and mortality.  The Differential Diagnoses  includes but is not exclusive to subarachnoid hemorrhage, meningitis, encephalitis, previous head trauma, cavernous venous thrombosis, muscle tension headache, glaucoma, temporal arteritis, migraine or migraine equivalent, etc.   Critical Interventions- IV hydralazine.    Medications  hydrALAZINE (APRESOLINE) injection 10 mg (10 mg Intravenous Given 11/05/21 2043)  iohexol (OMNIPAQUE) 350 MG/ML injection 100 mL (100 mLs Intravenous Contrast Given 11/05/21 2235)  cephALEXin (KEFLEX) capsule 500 mg (500 mg Oral Given 11/06/21  0401)    Reassessment after intervention:  BP improved. Symptoms resolved.    I did obtain Additional Historical Information from patient's family at bedside.  I decided to review pertinent External Data, and in summary patient with radiology reports from [facility] reviewed but cannot see prior images directly.    Clinical Laboratory Tests Ordered, included mild elevation in troponin. Creatinine at 1.37. LFTs and Bilirubin are WNL. Lactic acid normal.   Radiologic Tests Ordered, included CTA abdomen along with CTA head and neck. I independently interpreted the images and agree with radiology interpretation.   Cardiac Monitor Tracing which shows NSR.   Social Determinants of Health Risk patient is a prior smoker   Medical Decision Making: Summary:  Patient with known carotid dissection status postrepair at [facility] in several small dissection areas in the abdomen presents with abdominal pain and headache.  Given history, plan for hydralazine for blood pressure control.  Consider labetalol but  patient's heart rate already in the 50s after additional dose of metoprolol prior to arrival.  He appears very well with no neurologic exam findings.  He is awake and alert.  Very low suspicion for bleed.   CTA with dissections noted but appear to be in locations similar to what is described in [place]. Care transferred to Dr. Dina Rich. Elevated BP improved.    Disposition: pending  ____________________________________________  FINAL CLINICAL IMPRESSION(S) / ED DIAGNOSES  Final diagnoses:  Acute nonintractable headache, unspecified headache type  History of dissection of artery  Acute cystitis without hematuria     NEW OUTPATIENT MEDICATIONS STARTED DURING THIS VISIT:  Discharge Medication List as of 11/06/2021  3:50 AM     START taking these medications   Details  cephALEXin (KEFLEX) 500 MG capsule Take 1 capsule (500 mg total) by mouth 3 (three) times daily., Starting Sun 11/06/2021, Print        Note:  This document was prepared using Dragon voice recognition software and may include unintentional dictation errors.  Nanda Quinton, MD, [facility] Emergency Medicine    Charde Macfarlane, Wonda Olds, MD 11/08/21 [phone]

## 2021-11-06 DIAGNOSIS — R519 Headache, unspecified: Secondary | ICD-10-CM | POA: Diagnosis not present

## 2021-11-06 LAB — URINALYSIS, ROUTINE W REFLEX MICROSCOPIC
Bilirubin Urine: NEGATIVE
Glucose, UA: 50 mg/dL — AB
Ketones, ur: NEGATIVE mg/dL
Nitrite: NEGATIVE
Protein, ur: 30 mg/dL — AB
Specific Gravity, Urine: 1.009 (ref 1.005–1.030)
WBC, UA: >50 WBC/hpf — ABNORMAL HIGH (ref 0–5)
pH: 6 (ref 5.0–8.0)

## 2021-11-06 LAB — TROPONIN I (HIGH SENSITIVITY): Troponin I (High Sensitivity): 18 ng/L — ABNORMAL HIGH (ref <18)

## 2021-11-06 MED ORDER — CEPHALEXIN 250 MG PO CAPS
500.0000 mg | ORAL_CAPSULE | Freq: Once | ORAL | Status: AC
  Administered 2021-11-06: 500 mg via ORAL
  Filled 2021-11-06: qty 2

## 2021-11-06 MED ORDER — CEPHALEXIN 500 MG PO CAPS: 500.0000 mg | ORAL_CAPSULE | Freq: Three times a day (TID) | ORAL | 0 refills | Status: AC

## 2021-11-06 NOTE — ED Provider Notes (Signed)
Patient signed out pending CTA of the head and neck.  History of multiple arterial dissections.  Had an episode of abdominal discomfort, nausea, vomiting, headache earlier.  Was notably hypertensive.  Took blood pressure medications and blood pressure improved.  He was given hydralazine here.  He is on metoprolol and Eliquis.  Symptoms had resolved largely by the time he presented here.  However, symptoms were consistent with his recent presentation in September of multiple dissections.  For that reason, multiple CTAs were obtained.  Unfortunately, we cannot review his prior imaging; however, based on the reports in his care everywhere, his abdominal imaging shows known dissections from prior studies.  Regarding his CTA, see below.  I had a long discussion with the patient and his family.  He has been clinically asymptomatic while he is here.  He is nonfocal on exam.  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  I have offered to have our vascular surgeons review imaging to comment on chronicity.  Patient and family members declined.  They did request a urine given urinary frequency.  Urinalysis shows numerous white cells and bacteria.  This was cultured and he will be treated given his frequency.  I reviewed his vital signs.  Vital signs have down trended nicely and his blood pressure is 148/87 on my last evaluation.  Of note, he did have a troponin checked.  He never had any chest pain.  Initial troponin 11, repeat troponin 18.  There is no evidence of acute ischemia on his EKG and I have reviewed his chart from [facility] that showed a normal stress test in January 2023.  I did discuss with him the slight elevation in troponin but I am very reassured given his EKG and lack of symptoms consistent with ACS.  Recommend close follow-up with his vascular surgeon and primary doctors.  They were provided  with imaging reports.   Physical Exam  BP (!) 145/94    Pulse 67    Temp (!) 97.5 F (36.4 C)    Resp 17    Ht 1.753 m (5\' 9" )    Wt 72.6 kg    SpO2 98%    BMI 23.63 kg/m   Physical Exam Awake, alert, no acute distress Moves all 4 extremities Procedures  Procedures  ED Course / MDM   Clinical Course as of 11/06/21 0404  Sun Nov 06, 2021  0234 I had a long discussion with the patient and his family member regarding CT results.  Unfortunately I am unable to directly compare to outside CT scan which was done at time of intervention in September 2022.  Stent appears patent.  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  When compared to the report from his prior CTA in September, pseudoaneurysm was present but is now larger.  I favor chronic given that the patient is currently completely asymptomatic.  He has had no strokelike symptoms.  He is appropriately on anticoagulation.  I did offer to call our vascular surgeons to have them review imaging and weigh in; however, patient and his family are comfortable with following up with their outpatient vascular surgeon at [facility].  They do report urinary frequency.  We will send urinalysis. [CH]    Clinical Course User Index [CH] Bedelia Pong, Barbette Hair, MD   Medical Decision Making Amount and/or Complexity of Data Reviewed Labs: ordered.  Radiology: ordered.  Risk Prescription drug management.   Problem List Items Addressed This Visit   None Visit Diagnoses     Acute nonintractable headache, unspecified headache type    -  Primary   Relevant Medications   Metoprolol Tartrate 37.5 MG TABS   acetaminophen (TYLENOL) 650 MG CR tablet   History of dissection of artery       Acute cystitis without hematuria                 Dina Rich Barbette Hair, MD 11/06/21 0410

## 2021-11-06 NOTE — Discharge Instructions (Signed)
You were seen today for headache and abdominal pain.  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  Follow-up with your vascular surgeon.  You do have evidence of UTI.  Take antibiotics as prescribed.  If you have any new or worsening symptoms including headache, strokelike symptoms, you should call 911 and return to the emergency department immediately.

## 2021-11-09 LAB — URINE CULTURE: Culture: >=100000 — AB

## 2021-11-10 ENCOUNTER — Telehealth: Payer: Self-pay | Admitting: *Deleted

## 2021-11-10 NOTE — Telephone Encounter (Signed)
Post ED Visit - Positive Culture Follow-up  Culture report reviewed by antimicrobial stewardship pharmacist: [place] Team []  Elenor Quinones, Pharm.D. []  Heide Guile, Pharm.D., BCPS AQ-ID []  Parks Neptune, Pharm.D., BCPS []  Alycia Rossetti, Pharm.D., BCPS []  Renner Corner, Pharm.D., BCPS, AAHIVP []  Legrand Como, Pharm.D., BCPS, AAHIVP []  Salome Arnt, PharmD, BCPS []  Johnnette Gourd, PharmD, BCPS []  Hughes Better, PharmD, BCPS []  Leeroy Cha, PharmD []  Laqueta Linden, PharmD, BCPS []  Albertina Parr, PharmD  [place] [facility] []  Leodis Sias, PharmD []  Lindell Spar, PharmD []  Royetta Asal, PharmD []  Graylin Shiver, Rph []  Rema Fendt) Glennon Mac, PharmD []  Arlyn Dunning, PharmD []  Netta Cedars, PharmD []  Dia Sitter, PharmD []  Leone Haven, PharmD []  Gretta Arab, PharmD []  Theodis Shove, PharmD []  Peggyann Juba, PharmD []  Reuel Boom, PharmD   Positive urine culture Treated with Cephalexin, organism sensitive to the same and no further patient follow-up is required at this time.  Harlon Flor [facility] 11/10/2021, 11:32 AM

## 2022-08-16 IMAGING — CT CT ANGIO ABDOMEN
2 of 5 series · 14 of 46 positions shown, 16 images · non-contrast
Comparison: 05/12/2020.

CLINICAL DATA: Aortic aneurysm, known is suspected.

EXAM:
CT ANGIOGRAPHY ABDOMEN
TECHNIQUE: Multidetector CT imaging of the abdomen was performed using the
standard protocol during bolus administration of intravenous
contrast. Multiplanar reconstructed images and MIPs were obtained
and reviewed to evaluate the vascular anatomy.

[Series 5: dissection 2mm · axial · 0.70mm/px · z∈[-661,-417]mm · 11 of 142 slices shown, 13 images]
[im 10/142  soft-tissue]
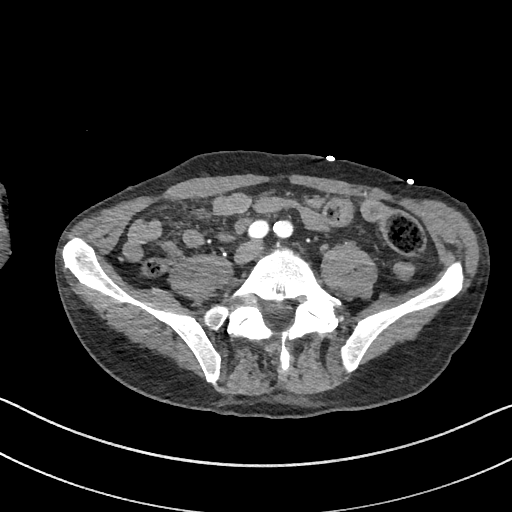
[im 10/142  bone]
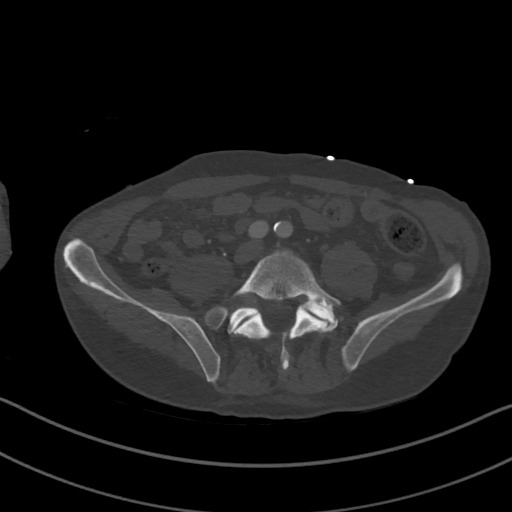
[im 23/142  soft-tissue]
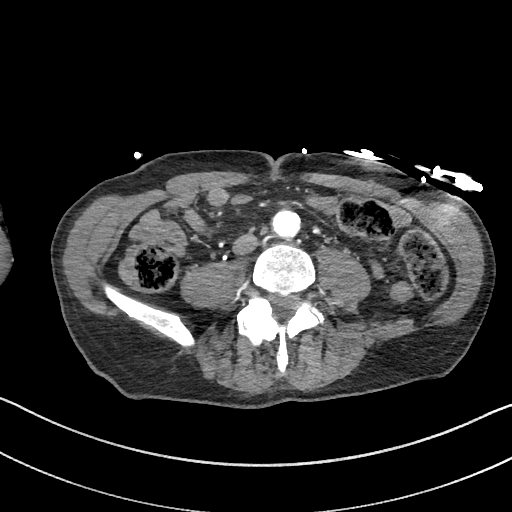
[im 32/142  soft-tissue]
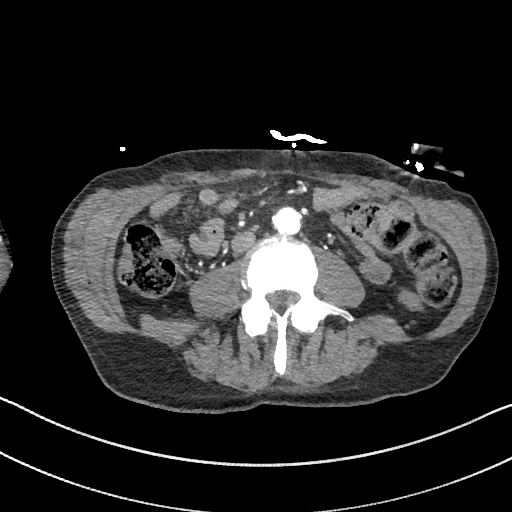
[im 46/142  soft-tissue]
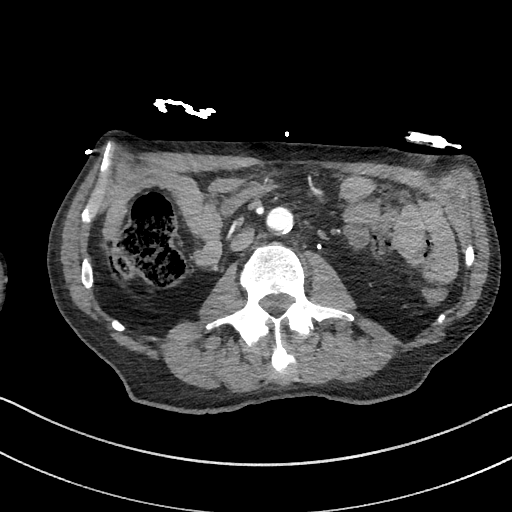
[im 60/142  soft-tissue]
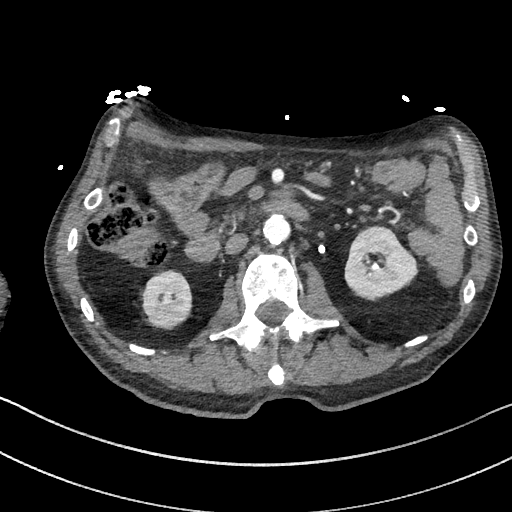
[im 73/142  soft-tissue]
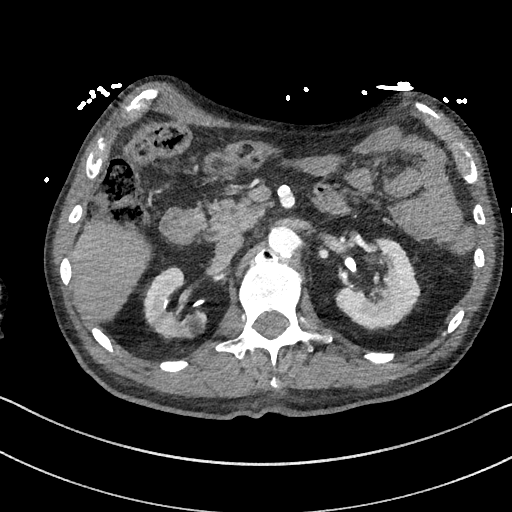
[im 82/142  soft-tissue]
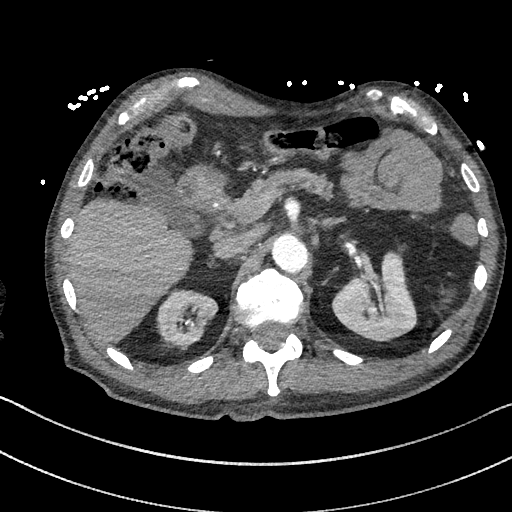
[im 96/142  soft-tissue]
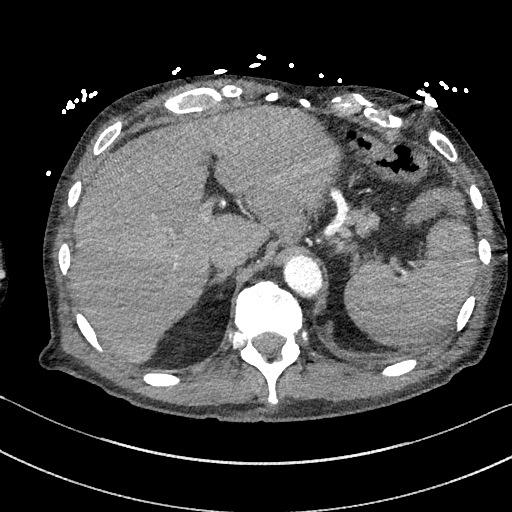
[im 110/142  soft-tissue]
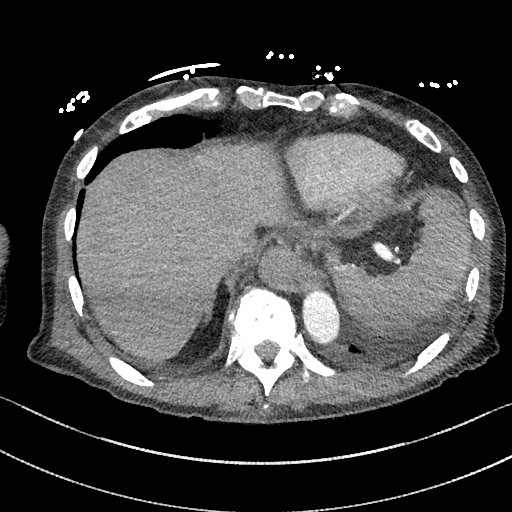
[im 110/142  bone]
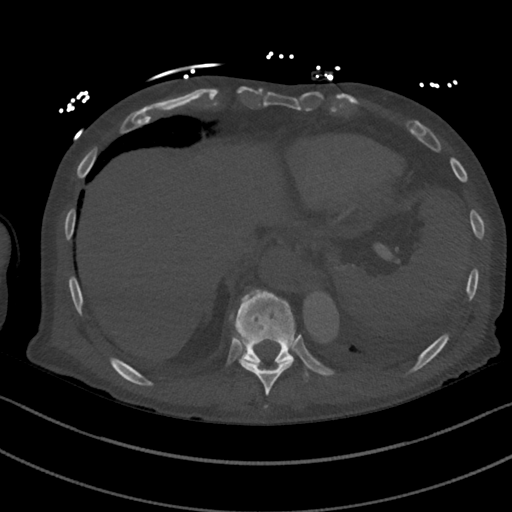
[im 119/142  soft-tissue]
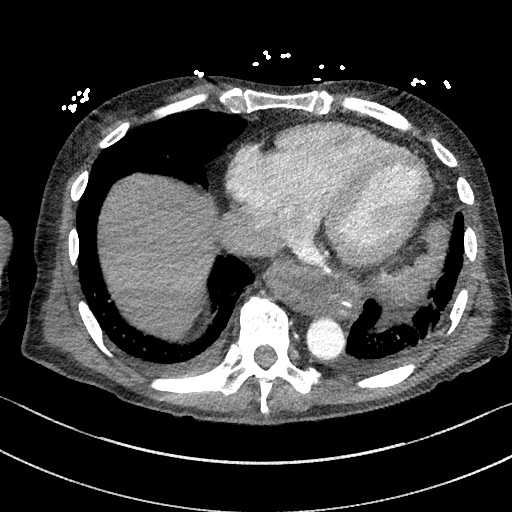
[im 132/142  soft-tissue]
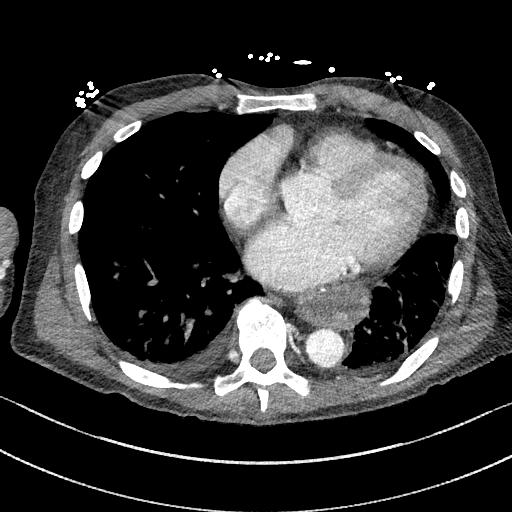

[Series 7: dissection 2mm cor · coronal · 0.55mm/px · 3 of 133 slices shown]
[im 34/133  soft-tissue]
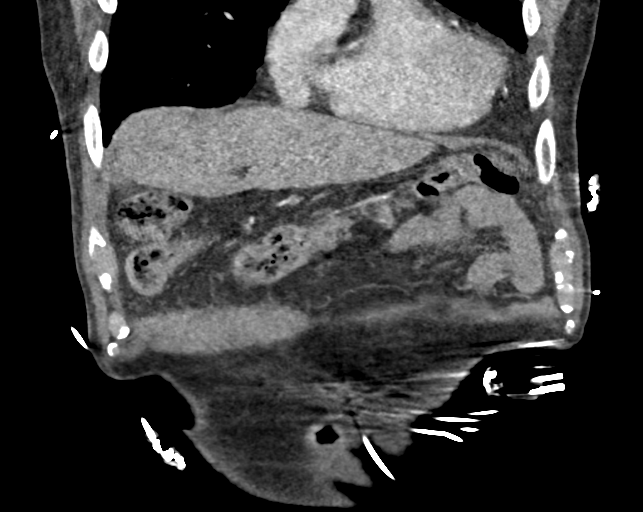
[im 67/133  soft-tissue]
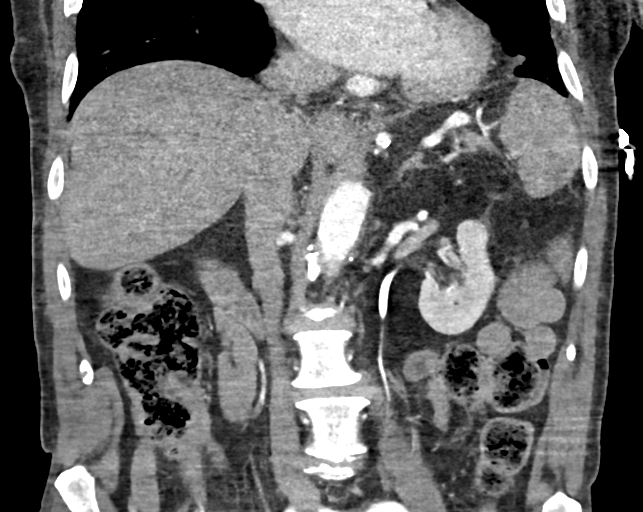
[im 100/133  soft-tissue]
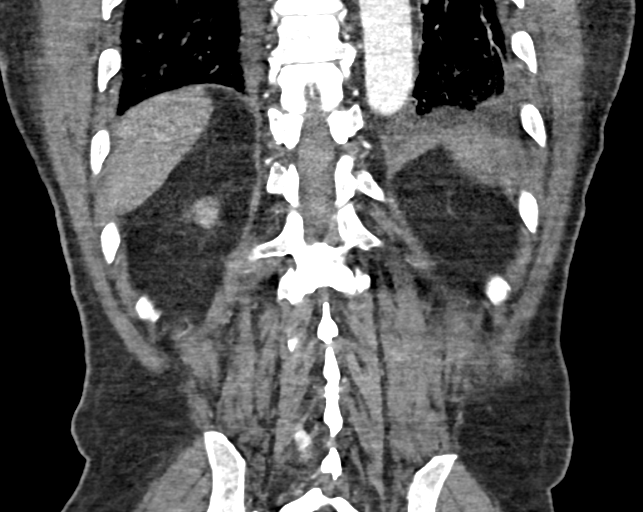

[14 of 46 positions shown; findings below may reference images not displayed]

RADIATION DOSE REDUCTION: This exam was performed according to the
departmental dose-optimization program which includes automated
exposure control, adjustment of the mA and/or kV according to
patient size and/or use of iterative reconstruction technique.

CONTRAST:  100mL OMNIPAQUE IOHEXOL 350 MG/ML SOLN
FINDINGS: VASCULAR

Aorta: Aortic atherosclerosis. Normal caliber aorta without
aneurysm, dissection, vasculitis or significant stenosis.

Celiac: There is mild narrowing of the proximal celiac artery with
poststenotic dilatation. A dissection flap is present in the celiac
artery and surrounding fat stranding is noted about the proximal
celiac artery suggesting acute process. The splenic artery and
hepatic artery extend from the true lumen. There is severe narrowing
of the proximal hepatic artery with normal enhancement of the mid to
distal hepatic artery. Evaluation is somewhat limited due to motion
artifact.

SMA: Mild atherosclerotic calcification with mild dilatation of the
mid SMA with suspected small dissection flap, which is unchanged
from 6462. Patent without evidence of aneurysm, dissection,
vasculitis or significant stenosis.

Renals: There is a dissection of the mid to distal renal artery on
the right with surrounding fat stranding suggesting acute process.
The renal artery remains patent in the kidneys enhance
symmetrically. The left renal artery renal arteries are patent
without evidence of aneurysm, dissection, vasculitis, fibromuscular
dysplasia or significant stenosis.

IMA: Patent without evidence of aneurysm, dissection, vasculitis or
significant stenosis.

Inflow: Mild atherosclerosis. Patent without evidence of aneurysm,
dissection, vasculitis or significant stenosis.

Veins: No obvious venous abnormality within the limitations of this
arterial phase study.

Review of the MIP images confirms the above findings.

NON-VASCULAR

Lower chest: The heart is enlarged and there is no pericardial
effusion. Scattered coronary artery calcifications are noted. There
is a large hiatal hernia. Small bilateral pleural effusions are
present with mild edema and/or atelectasis at the lung bases.

Hepatobiliary: No focal liver abnormality is seen. No gallstones,
gallbladder wall thickening, or biliary dilatation.

Pancreas: Unremarkable. No pancreatic ductal dilatation or
surrounding inflammatory changes.

Spleen: Normal in size without focal abnormality.

Adrenals/Urinary Tract: The adrenal glands are within normal limits.
Evaluation for renal contrast is limited due the presence of
excreted contrast at the renal pyramids. A cyst is present in the
mid right kidney no hydronephrosis bilaterally.

Stomach/Bowel: There is a large hiatal hernia in the stomach is
almost completely intrathoracic in location. Appendix appears
normal. No evidence of bowel wall thickening, distention, or
inflammatory changes.

Lymphatic: Aortic atherosclerosis. No enlarged abdominal or pelvic
lymph nodes.

Other: No abdominal wall hernia or abnormality. No abdominopelvic
ascites.

Musculoskeletal: Degenerative changes in the thoracolumbar spine. No
acute osseous abnormality.
IMPRESSION: VASCULAR

1. Aortic atherosclerosis with no evidence of aortic dissection.
2. Acute dissection of the celiac artery. The splenic and hepatic
artery extend from the true lumen. There is severe narrowing of the
proximal hepatic artery at the origin on the celiac artery with
normal enhancement distally. The splenic artery is within normal
limits. Interventional consultation is recommended.
3. Acute dissection of the right renal artery with no significant
stenosis. Interventional consultation is recommended.
4. Atherosclerotic calcification of the mid SMA with dissection
flap, unchanged from 6462.

NON-VASCULAR

1. Small bilateral pleural effusions with likely edema and
atelectasis at the lung bases.
2. Cardiomegaly with coronary artery calcifications.
3. Remaining findings as described above.

## 2022-08-16 IMAGING — CT CT ANGIO NECK
1 of 11 series · 5 of 33 positions shown · IV contrast (OMNI 350)
Comparison: Prior CT from 06/07/2021.

CLINICAL DATA: Initial evaluation for suspected carotid dissection.

EXAM:
CT ANGIOGRAPHY HEAD AND NECK
TECHNIQUE: Multidetector CT imaging of the head and neck was performed using
the standard protocol during bolus administration of intravenous
contrast. Multiplanar CT image reconstructions and MIPs were
obtained to evaluate the vascular anatomy. Carotid stenosis
measurements (when applicable) are obtained utilizing NASCET
criteria, using the distal internal carotid diameter as the
denominator.

[Series 11: cta neck axial · axial · 0.39mm/px · z∈[-253,-19]mm · 5 of 351 slices shown]
[im 59/351  soft-tissue]
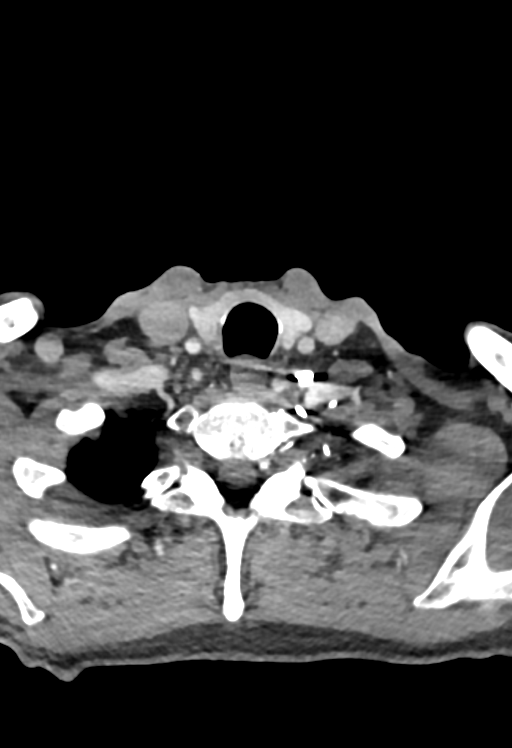
[im 117/351  bone]
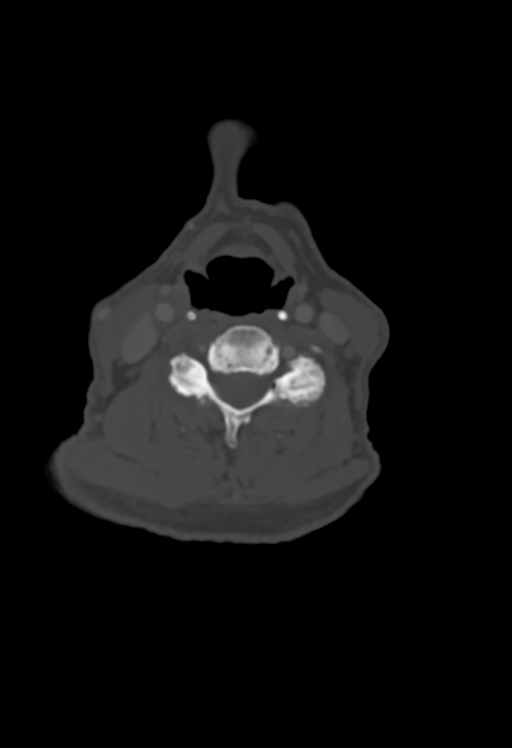
[im 176/351  soft-tissue]
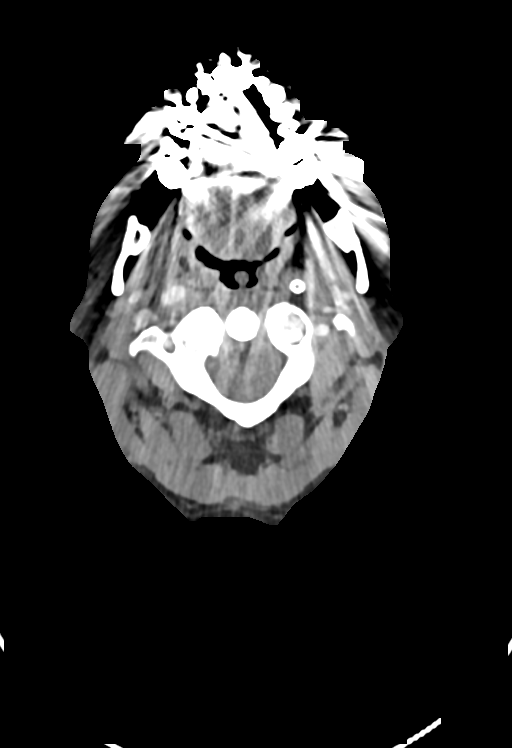
[im 234/351  bone]
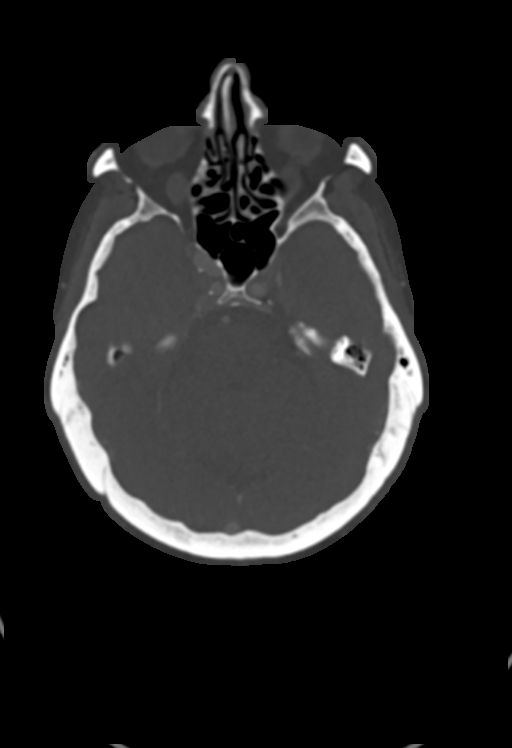
[im 292/351  soft-tissue]
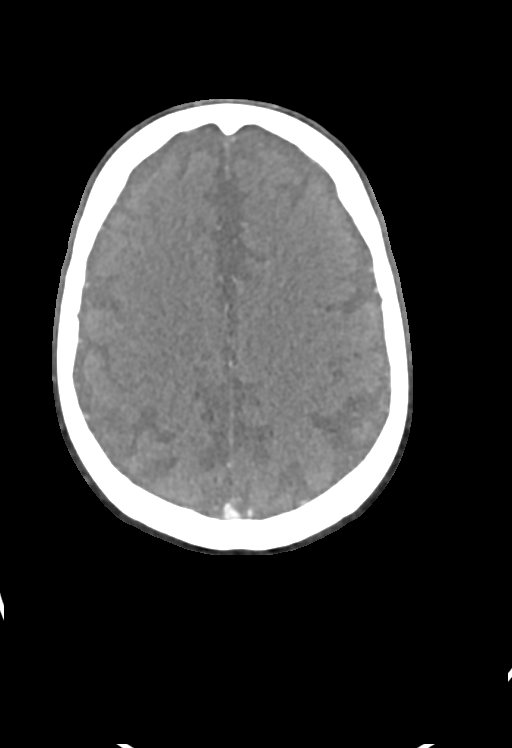

[5 of 33 positions shown; findings below may reference images not displayed]

RADIATION DOSE REDUCTION: This exam was performed according to the
departmental dose-optimization program which includes automated
exposure control, adjustment of the mA and/or kV according to
patient size and/or use of iterative reconstruction technique.

CONTRAST:  100mL OMNIPAQUE IOHEXOL 350 MG/ML SOLN
FINDINGS: CT HEAD FINDINGS

Brain: Generalized age-related cerebral atrophy with mild chronic
small vessel ischemic disease. No acute intracranial hemorrhage. No
acute large vessel territory infarct. No mass lesion or midline
shift. No hydrocephalus or extra-axial fluid collection.

Vascular: No hyperdense vessel. Calcified atherosclerosis present at
the skull base.

Skull: Scalp soft tissues and calvarium demonstrate no acute
finding.

Sinuses: Mild mucoperiosteal thickening present within the ethmoidal
air cells. Paranasal sinuses and mastoid air cells are otherwise
clear.

Orbits: Globes orbital soft tissues demonstrate no acute finding.

Review of the MIP images confirms the above findings

CTA NECK FINDINGS

Aortic arch: Partially visualized aortic arch normal in caliber.
Partially visualize origin of the great vessels within normal limits
without significant stenosis.

Right carotid system: Right CCA patent from its origin to the
bifurcation without stenosis. No significant atheromatous narrowing
about the right carotid bulb. Visualized right ICA patent without
stenosis or dissection. Evaluation somewhat limited by streak
artifact from dental amalgam.

Left carotid system: Visualized left CCA patent from its origin to
the bifurcation without stenosis. No significant atheromatous
narrowing about the left carotid bulb. Left ICA widely patent
proximally. Vascular stent in place within the mid-distal cervical
left ICA. Patent flow seen through the stent. Crest enteric
extraluminal contrast seen along the anterior margin of the left ICA
stent, measuring 1.1 x 0.7 x 1.7 cm (series 11, image 168). This is
smoothly marginated, and is favored to be chronic. Residual and/or
recurrent dissection with pseudoaneurysm not excluded. No raised
dissection flap or significant intimal irregularity.

Vertebral arteries: Both vertebral arteries arise from the
subclavian arteries. No visible proximal subclavian artery stenosis.
Atheromatous plaque at the origin of the dominant left vertebral
artery with mild ostial stenosis. Visualized vertebral arteries
otherwise patent without stenosis or dissection.

Skeleton: No discrete or worrisome osseous lesions. Mild for age
cervical spondylosis.

Other neck: No other acute soft tissue abnormality within the neck.

Upper chest: Left pleural effusion partially visualized. Scattered
atelectatic changes within the visualized lungs. Intraluminal fluid
density noted within the visualized mid esophageal lumen.

Review of the MIP images confirms the above findings

CTA HEAD FINDINGS

Anterior circulation: Petrous segments patent bilaterally. Scattered
atheromatous change within the carotid siphons without
hemodynamically significant stenosis. A1 segments patent
bilaterally. Normal anterior communicating artery complex. Anterior
cerebral arteries patent without stenosis. No M1 stenosis or
occlusion. Normal MCA bifurcations. No visible proximal MCA branch
occlusion. Distal MCA branches grossly perfused and symmetric.

Posterior circulation: Atheromatous change within the dominant left
V4 segment without hemodynamically significant stenosis. Left PICA
patent. Right vertebral artery widely patent to the takeoff of the
right PICA. Right PICA itself is patent. Right V4 segment occludes
just prior to the vertebrobasilar junction. Basilar patent to its
distal aspect without stenosis. Superior cerebral arteries grossly
patent bilaterally. Both PCAs primarily supplied via the basilar.
PCAs patent to their distal aspects without stenosis.

Venous sinuses: Patent allowing for timing the contrast bolus.

Anatomic variants: None significant.  No aneurysm.

Review of the MIP images confirms the above findings
IMPRESSION: CT HEAD IMPRESSION:

1. No acute intracranial abnormality.
2. Age-related cerebral atrophy with mild chronic small vessel
ischemic disease.

CTA HEAD AND NECK IMPRESSION:

1. Vascular stent in place within the mid-distal left cervical ICA,
with patent flow through the stent. [DATE] x 0.7 x 1.7 cm cresenteric
extraluminal contrast along the anterior margin of the stent as
above, age indeterminate, and could be chronic. Residual and/or
recurrent dissection with pseudoaneurysm not excluded. No raised
dissection flap or significant intimal irregularity. If available,
correlation with prior studies would likely be helpful to evaluate
the chronicity of this finding.
2. Hypoplastic right vertebral artery occludes just prior to the
vertebrobasilar junction, likely chronic.
3. Otherwise negative CTA for acute large vessel occlusion.
4. Additional mild-to-moderate atheromatous irregularity about the
major arterial vasculature of the head and neck as above. No other
hemodynamically significant or correctable stenosis.
5. Left pleural effusion, partially visualized.
